# Patient Record
Sex: Female | Born: 1995 | Race: Black or African American | Hispanic: No | Marital: Single | Smoking: Former smoker
Health system: Southern US, Community
[De-identification: ages and names within clinical notes are randomized; demographics above are authoritative.]

## PROBLEM LIST (undated history)

## (undated) ENCOUNTER — Inpatient Hospital Stay: Payer: Self-pay

## (undated) DIAGNOSIS — D582 Other hemoglobinopathies: Secondary | ICD-10-CM

## (undated) DIAGNOSIS — F32A Depression, unspecified: Secondary | ICD-10-CM

## (undated) DIAGNOSIS — O99019 Anemia complicating pregnancy, unspecified trimester: Secondary | ICD-10-CM

## (undated) DIAGNOSIS — Z8619 Personal history of other infectious and parasitic diseases: Secondary | ICD-10-CM

## (undated) DIAGNOSIS — F329 Major depressive disorder, single episode, unspecified: Secondary | ICD-10-CM

## (undated) HISTORY — PX: NO PAST SURGERIES: SHX2092

## (undated) HISTORY — DX: Personal history of other infectious and parasitic diseases: Z86.19

---
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
## 2014-02-02 ENCOUNTER — Emergency Department: Payer: Self-pay | Admitting: Internal Medicine

## 2014-02-02 LAB — URINALYSIS, COMPLETE
BACTERIA: NONE SEEN
Bilirubin,UR: NEGATIVE
Blood: NEGATIVE
GLUCOSE, UR: NEGATIVE mg/dL (ref 0–75)
KETONE: NEGATIVE
Nitrite: NEGATIVE
PH: 5 (ref 4.5–8.0)
Protein: NEGATIVE
RBC,UR: 2 /HPF (ref 0–5)
Specific Gravity: 1.015 (ref 1.003–1.030)

## 2014-09-02 ENCOUNTER — Emergency Department
Admission: EM | Admit: 2014-09-02 | Discharge: 2014-09-02 | Disposition: A | Discharge status: Home / Self Care | Payer: 59 | Attending: Emergency Medicine | Admitting: Emergency Medicine

## 2014-09-02 ENCOUNTER — Encounter: Payer: Self-pay | Admitting: Emergency Medicine

## 2014-09-02 DIAGNOSIS — J988 Other specified respiratory disorders: Secondary | ICD-10-CM

## 2014-09-02 DIAGNOSIS — B9789 Other viral agents as the cause of diseases classified elsewhere: Secondary | ICD-10-CM

## 2014-09-02 DIAGNOSIS — Z720 Tobacco use: Secondary | ICD-10-CM | POA: Diagnosis not present

## 2014-09-02 DIAGNOSIS — J069 Acute upper respiratory infection, unspecified: Secondary | ICD-10-CM | POA: Insufficient documentation

## 2014-09-02 DIAGNOSIS — J029 Acute pharyngitis, unspecified: Secondary | ICD-10-CM | POA: Diagnosis present

## 2014-09-02 LAB — POCT RAPID STREP A: STREPTOCOCCUS, GROUP A SCREEN (DIRECT): NEGATIVE

## 2014-09-02 MED ORDER — PSEUDOEPH-BROMPHEN-DM 30-2-10 MG/5ML PO SYRP: 5.0000 mL | ORAL_SOLUTION | Freq: Four times a day (QID) | ORAL | Status: DC | PRN

## 2014-09-02 MED ORDER — IBUPROFEN 800 MG PO TABS: 800.0000 mg | ORAL_TABLET | Freq: Three times a day (TID) | ORAL | Status: DC | PRN

## 2014-09-02 NOTE — ED Notes (Signed)
Reports sore throat, coughing up green mucus, chills.  No resp distress

## 2014-09-02 NOTE — ED Provider Notes (Signed)
[facility] Emergency Department Provider Note  ____________________________________________  Time seen: Approximately 10:01 AM  I have reviewed the triage vital signs and the nursing notes.   HISTORY  Chief Complaint Sore Throat and Cough    HPI Carrie Frazier is a 19 y.o. female complaining of 3 days of sore throat and productive greenish cough and chills. Patient stated there is nausea but no vomiting. Patient denies diarrhea. No palliative measures taken for this complaint. Patient is rating her pain discomfort as a 10 over 10. Patient describes pain as dull.   History reviewed. No pertinent past medical history.  There are no active problems to display for this patient.   History reviewed. No pertinent past surgical history.  No current outpatient prescriptions on file.  Allergies Review of patient's allergies indicates no known allergies.  History reviewed. No pertinent family history.  Social History Social History  Substance Use Topics  . Smoking status: Current Some Day Smoker  . Smokeless tobacco: None  . Alcohol Use: Yes    Review of Systems Constitutional: Chills Eyes: No visual changes. ENT: Sore throat Cardiovascular: Denies chest pain. Respiratory: Denies shortness of breath. Gastrointestinal: No abdominal pain.  nausea, no vomiting.  No diarrhea.  No constipation. Genitourinary: Negative for dysuria. Musculoskeletal: Negative for back pain. Skin: Negative for rash. Neurological: Negative for headaches, focal weakness or numbness. 10-point ROS otherwise negative.  ____________________________________________   PHYSICAL EXAM:  VITAL SIGNS: ED Triage Vitals  Enc Vitals Group     BP 09/02/14 0947 119/70 mmHg     Pulse Rate 09/02/14 0947 82     Resp 09/02/14 0947 20     Temp 09/02/14 0947 98.7 F (37.1 C)     Temp Source 09/02/14 0947 Oral     SpO2 09/02/14 0947 100 %     Weight 09/02/14 0947 130 lb (58.968  kg)     Height 09/02/14 0947  (1.6 m)     Head Cir --      Peak Flow --      Pain Score 09/02/14 0948 10     Pain Loc --      Pain Edu? --      Excl. in GC? --     Constitutional: Alert and oriented. Well appearing and in no acute distress. Eyes: Conjunctivae are normal. PERRL. EOMI. Head: Atraumatic. Nose: Edematous nasal turbinates with clear rhinorrhea. Mouth/Throat: Mucous membranes are moist.  Oropharynx erythematous. Postnasal discharge Neck: No stridor.  No cervical spine tenderness to palpation. Hematological/Lymphatic/Immunilogical: No cervical lymphadenopathy. Cardiovascular: Normal rate, regular rhythm. Grossly normal heart sounds.  Good peripheral circulation. Respiratory: Normal respiratory effort.  No retractions. Lungs CTAB. Gastrointestinal: Soft and nontender. No distention. No abdominal bruits. No CVA tenderness. Musculoskeletal: No lower extremity tenderness nor edema.  No joint effusions. Neurologic:  Normal speech and language. No gross focal neurologic deficits are appreciated. No gait instability. Skin:  Skin is warm, dry and intact. No rash noted. Psychiatric: Mood and affect are normal. Speech and behavior are normal.  ____________________________________________   LABS (all labs ordered are listed, but only abnormal results are displayed)  Labs Reviewed  CULTURE, GROUP A STREP ([facility] ONLY)  POCT RAPID STREP A   ____________________________________________  EKG   ____________________________________________  RADIOLOGY   ____________________________________________   PROCEDURES  Procedure(s) performed: None  Critical Care performed: No  ____________________________________________   INITIAL IMPRESSION / ASSESSMENT AND PLAN / ED COURSE  Pertinent labs & imaging results that were available during my  care of the patient were reviewed by me and considered in my medical decision making (see chart for details).  Viral respiratory  infection. Discussed use none antibodies. Patient given a prescription for Brown fell DM and ibuprofen. [facility]. Patient given a work excuse. ____________________________________________   FINAL CLINICAL IMPRESSION(S) / ED DIAGNOSES  Final diagnoses:  Viral respiratory illness      Joni Reining, PA-C 09/02/14 1027  Minna Antis, MD 09/02/14 [phone]

## 2014-09-04 LAB — CULTURE, GROUP A STREP (THRC)

## 2014-09-05 NOTE — Progress Notes (Signed)
Dr. Gladstone Pih authorized amoxicillin 500 mg bid for 10 days for group a strep pharyngitis on 09/05/14. Attempted to call patient to find a suitable pharmacy, however phone number in computer is a wrong number.

## 2014-12-16 ENCOUNTER — Encounter: Payer: Self-pay | Admitting: *Deleted

## 2014-12-16 ENCOUNTER — Emergency Department
Admission: EM | Admit: 2014-12-16 | Discharge: 2014-12-16 | Discharge status: Against Medical Advice | Payer: 59 | Attending: Emergency Medicine | Admitting: Emergency Medicine

## 2014-12-16 DIAGNOSIS — R197 Diarrhea, unspecified: Secondary | ICD-10-CM | POA: Insufficient documentation

## 2014-12-16 DIAGNOSIS — R1110 Vomiting, unspecified: Secondary | ICD-10-CM | POA: Insufficient documentation

## 2014-12-16 DIAGNOSIS — F17200 Nicotine dependence, unspecified, uncomplicated: Secondary | ICD-10-CM | POA: Insufficient documentation

## 2014-12-16 DIAGNOSIS — R109 Unspecified abdominal pain: Secondary | ICD-10-CM | POA: Diagnosis present

## 2014-12-16 DIAGNOSIS — J029 Acute pharyngitis, unspecified: Secondary | ICD-10-CM | POA: Insufficient documentation

## 2014-12-16 MED ORDER — ONDANSETRON HCL 4 MG/2ML IJ SOLN: 4.0000 mg | Freq: Once | INTRAMUSCULAR | Status: DC | PRN

## 2014-12-16 NOTE — ED Notes (Signed)
Pt c/o abdominal pain starting early Friday morning. Pt reports repeated episodes of vomiting and diarrhea all day on Friday. Pt c/o sore throat starting in the past few hours along w/ chills.

## 2014-12-19 ENCOUNTER — Telehealth: Payer: Self-pay | Admitting: Emergency Medicine

## 2014-12-19 NOTE — ED Notes (Signed)
Called patient due to lwot to inquire about condition and follow up plans. Left message with my number. 

## 2015-03-28 ENCOUNTER — Encounter: Payer: Self-pay | Admitting: Obstetrics and Gynecology

## 2015-04-13 ENCOUNTER — Emergency Department
Admission: EM | Admit: 2015-04-13 | Discharge: 2015-04-13 | Disposition: A | Discharge status: Home / Self Care | Payer: 59 | Attending: Emergency Medicine | Admitting: Emergency Medicine

## 2015-04-13 ENCOUNTER — Encounter: Payer: Self-pay | Admitting: Emergency Medicine

## 2015-04-13 DIAGNOSIS — Z87891 Personal history of nicotine dependence: Secondary | ICD-10-CM | POA: Diagnosis not present

## 2015-04-13 DIAGNOSIS — F1290 Cannabis use, unspecified, uncomplicated: Secondary | ICD-10-CM | POA: Insufficient documentation

## 2015-04-13 DIAGNOSIS — S058X2A Other injuries of left eye and orbit, initial encounter: Secondary | ICD-10-CM

## 2015-04-13 DIAGNOSIS — Y999 Unspecified external cause status: Secondary | ICD-10-CM | POA: Insufficient documentation

## 2015-04-13 DIAGNOSIS — H109 Unspecified conjunctivitis: Secondary | ICD-10-CM | POA: Diagnosis present

## 2015-04-13 DIAGNOSIS — S0502XA Injury of conjunctiva and corneal abrasion without foreign body, left eye, initial encounter: Secondary | ICD-10-CM | POA: Insufficient documentation

## 2015-04-13 DIAGNOSIS — Y929 Unspecified place or not applicable: Secondary | ICD-10-CM | POA: Insufficient documentation

## 2015-04-13 DIAGNOSIS — Y9389 Activity, other specified: Secondary | ICD-10-CM | POA: Diagnosis not present

## 2015-04-13 MED ORDER — FLUORESCEIN SODIUM 1 MG OP STRP
1.0000 | ORAL_STRIP | Freq: Once | OPHTHALMIC | Status: DC
  Filled 2015-04-13: qty 1

## 2015-04-13 MED ORDER — EYE WASH OPHTH SOLN
2.0000 [drp] | OPHTHALMIC | Status: DC | PRN
  Filled 2015-04-13 (×2): qty 118

## 2015-04-13 MED ORDER — TETRACAINE HCL 0.5 % OP SOLN
2.0000 [drp] | Freq: Once | OPHTHALMIC | Status: DC
  Filled 2015-04-13: qty 2

## 2015-04-13 MED ORDER — ERYTHROMYCIN 5 MG/GM OP OINT: 1.0000 "application " | TOPICAL_OINTMENT | Freq: Four times a day (QID) | OPHTHALMIC | Status: DC

## 2015-04-13 MED ORDER — ERYTHROMYCIN 5 MG/GM OP OINT
TOPICAL_OINTMENT | Freq: Once | OPHTHALMIC | Status: DC
  Filled 2015-04-13: qty 1

## 2015-04-13 NOTE — ED Notes (Signed)
Pt states that she was wrestling with her nephew yesterday and he stuck his finger in her left eye, pt has blood noted to the inner portion of the sclera with swelling to her upper eyelid, pt states it's painful to move her eye. Pt also states that she has a headache

## 2015-04-13 NOTE — Discharge Instructions (Signed)

## 2015-04-13 NOTE — ED Notes (Signed)
Left eye redness, "goopy" upon waking up this am, and itching.

## 2015-04-13 NOTE — ED Provider Notes (Signed)
[facility] Emergency Department Provider Note  ____________________________________________  Time seen: Approximately 8:07 AM  I have reviewed the triage vital signs and the nursing notes.   HISTORY  Chief Complaint Conjunctivitis    HPI Carrie Frazier is a 20 y.o. female, NAD, presents to the emergency department with one-day history of left eye redness, pain, oozing and itching. States her nephew stuck his finger in her left eye yesterday and has had increasing blood in that area over the last 24 hours. Woke this morning with swelling about the eyelids and yellow matted discharge. Has had pain and itching about the eye today. Swelling has gone down since waking this morning. Denies any loss of vision but has felt her vision is blurry due to the discharge.    History reviewed. No pertinent past medical history.  There are no active problems to display for this patient.   History reviewed. No pertinent past surgical history.  Current Outpatient Rx  Name  Route  Sig  Dispense  Refill  . brompheniramine-pseudoephedrine-DM 30-2-10 MG/5ML syrup   Oral   Take 5 mLs by mouth 4 (four) times daily as needed.   120 mL   0   . erythromycin ophthalmic ointment   Left Eye   Place 1 application into the left eye 4 (four) times daily. Apply 1cm ribbon to lower eyelid 4 times daily.   3.5 g   0   . ibuprofen (ADVIL,MOTRIN) 800 MG tablet   Oral   Take 1 tablet (800 mg total) by mouth every 8 (eight) hours as needed for moderate pain.   15 tablet   0     Allergies Review of patient's allergies indicates no known allergies.  No family history on file.  Social History Social History  Substance Use Topics  . Smoking status: Former Games developermoker  . Smokeless tobacco: None  . Alcohol Use: Yes     Review of Systems  Constitutional: No fever/chills Eyes: Positive blurry vision. Positive yellow discharge, eye redness, eye pain about the left eye.  Positive left eyelid swelling. No loss of vision. ENT: Positive nasal congestion, runny nose. No sore throat, sinus pressure. Cardiovascular: No chest pain. Respiratory: No cough.   Skin: Negative for rash, skin sores. Neurological: Negative for headaches, focal weakness or numbness. 10-point ROS otherwise negative.  ____________________________________________   PHYSICAL EXAM:  VITAL SIGNS: ED Triage Vitals  Enc Vitals Group     BP 04/13/15 0736 91/65 mmHg     Pulse Rate 04/13/15 0736 80     Resp 04/13/15 0736 18     Temp 04/13/15 0736 98.2 F (36.8 C)     Temp Source 04/13/15 0736 Oral     SpO2 04/13/15 0736 100 %     Weight 04/13/15 0733 120 lb (54.432 kg)     Height 04/13/15 0733 5\' 3"  (1.6 m)     Head Cir --      Peak Flow --      Pain Score 04/13/15 0734 10     Pain Loc --      Pain Edu? --      Excl. in GC? --     Constitutional: Alert and oriented. Well appearing and in no acute distress. Eyes: Left conjunctiva with erythema and hemorrhage in the medial upper portion. Fluorescein uptake was visualized under black light in the medial upper portion of the left eye in a linear fashion approximately 1 cm in length and 2 mm in width. No waterfall  sign was noted. PERRL. EOMI without pain.  Head: Atraumatic. ENT:      Nose: No congestion but trace rhinnorhea.      Mouth/Throat: Mucous membranes are moist.  Neck: Supple with full range of motion Hematological/Lymphatic/Immunilogical: No cervical lymphadenopathy. Respiratory: Normal respiratory effort without tachypnea or retractions.  Neurologic:  Normal speech and language. No gross focal neurologic deficits are appreciated.  Skin:  Skin is warm, dry and intact. No rash noted. Psychiatric: Mood and affect are normal. Speech and behavior are normal. Patient exhibits appropriate insight and judgement.   ____________________________________________    LABS  None  ____________________________________________  EKG  None ____________________________________________  RADIOLOGY  None ____________________________________________    PROCEDURES  Procedure(s) performed: None    Medications  tetracaine (PONTOCAINE) 0.5 % ophthalmic solution 2 drop (not administered)  fluorescein ophthalmic strip 1 strip (not administered)  eye wash ((SODIUM/POTASSIUM/SOD CHLORIDE)) ophthalmic solution 2 drop (not administered)  erythromycin ophthalmic ointment (not administered)     ____________________________________________   INITIAL IMPRESSION / ASSESSMENT AND PLAN / ED COURSE  Patient's diagnosis is consistent with abrasion of left eye. Application of erythromycin ophthalmic ointment was completed about the left lower eyelid prior to the patient being discharged. She'll also be discharged home with prescription for erythromycin ophthalmic ointment and counseled on use of such. She is to follow-up with [facility] today or tomorrow. Patient was given address and phone number to call them this morning to go ahead and schedule an appointment for follow-up.  Patient is given ED precautions to return to the ED for any worsening or new symptoms.    ____________________________________________  FINAL CLINICAL IMPRESSION(S) / ED DIAGNOSES  Final diagnoses:  Abrasion of left eye, initial encounter      NEW MEDICATIONS STARTED DURING THIS VISIT:  Discharge Medication List as of 04/13/2015  8:42 AM    START taking these medications   Details  erythromycin ophthalmic ointment Place 1 application into the left eye 4 (four) times daily. Apply 1cm ribbon to lower eyelid 4 times daily., Starting 04/13/2015, Until Discontinued, Print             Hope Pigeon, PA-C 04/13/15 9147  Jennye Moccasin, MD 04/13/15 [phone]

## 2015-06-28 DIAGNOSIS — F40231 Fear of injections and transfusions: Secondary | ICD-10-CM | POA: Insufficient documentation

## 2015-06-28 LAB — OB RESULTS CONSOLE VARICELLA ZOSTER ANTIBODY, IGG: VARICELLA IGG: IMMUNE

## 2015-06-28 LAB — OB RESULTS CONSOLE HEPATITIS B SURFACE ANTIGEN: Hepatitis B Surface Ag: NEGATIVE

## 2015-06-28 LAB — HM HIV SCREENING LAB: HM HIV Screening: NEGATIVE

## 2015-06-28 LAB — OB RESULTS CONSOLE RUBELLA ANTIBODY, IGM: RUBELLA: IMMUNE

## 2015-09-14 ENCOUNTER — Other Ambulatory Visit: Payer: Self-pay | Admitting: Obstetrics & Gynecology

## 2015-09-14 DIAGNOSIS — Z3689 Encounter for other specified antenatal screening: Secondary | ICD-10-CM

## 2015-09-20 ENCOUNTER — Ambulatory Visit
Admission: RE | Admit: 2015-09-20 | Discharge: 2015-09-20 | Disposition: A | Discharge status: Home / Self Care | Payer: 59 | Source: Ambulatory Visit | Attending: Obstetrics & Gynecology | Admitting: Obstetrics & Gynecology

## 2015-09-20 ENCOUNTER — Encounter: Payer: Self-pay | Admitting: Radiology

## 2015-09-20 DIAGNOSIS — Z36 Encounter for antenatal screening of mother: Secondary | ICD-10-CM | POA: Diagnosis present

## 2015-09-20 DIAGNOSIS — Z3A21 21 weeks gestation of pregnancy: Secondary | ICD-10-CM | POA: Diagnosis not present

## 2015-09-20 DIAGNOSIS — Z3689 Encounter for other specified antenatal screening: Secondary | ICD-10-CM

## 2015-11-06 LAB — OB RESULTS CONSOLE HIV ANTIBODY (ROUTINE TESTING): HIV: NONREACTIVE

## 2015-11-06 LAB — OB RESULTS CONSOLE RPR: RPR: NONREACTIVE

## 2015-11-26 ENCOUNTER — Inpatient Hospital Stay
Admission: EM | Admit: 2015-11-26 | Discharge: 2015-11-26 | Disposition: A | Discharge status: Home / Self Care | Payer: 59 | Attending: Certified Nurse Midwife | Admitting: Certified Nurse Midwife

## 2015-11-26 DIAGNOSIS — W07XXXA Fall from chair, initial encounter: Secondary | ICD-10-CM | POA: Diagnosis not present

## 2015-11-26 DIAGNOSIS — S30811A Abrasion of abdominal wall, initial encounter: Secondary | ICD-10-CM | POA: Insufficient documentation

## 2015-11-26 DIAGNOSIS — Y92000 Kitchen of unspecified non-institutional (private) residence as  the place of occurrence of the external cause: Secondary | ICD-10-CM | POA: Insufficient documentation

## 2015-11-26 DIAGNOSIS — Z8249 Family history of ischemic heart disease and other diseases of the circulatory system: Secondary | ICD-10-CM | POA: Insufficient documentation

## 2015-11-26 DIAGNOSIS — Z87891 Personal history of nicotine dependence: Secondary | ICD-10-CM | POA: Insufficient documentation

## 2015-11-26 DIAGNOSIS — S80811A Abrasion, right lower leg, initial encounter: Secondary | ICD-10-CM | POA: Diagnosis not present

## 2015-11-26 DIAGNOSIS — Z3A31 31 weeks gestation of pregnancy: Secondary | ICD-10-CM | POA: Insufficient documentation

## 2015-11-26 DIAGNOSIS — Z8619 Personal history of other infectious and parasitic diseases: Secondary | ICD-10-CM | POA: Diagnosis not present

## 2015-11-26 NOTE — Discharge Instructions (Signed)
Drink plenty of fluid and get plenty of rest. Call your provider for any other concerns °

## 2015-11-26 NOTE — OB Triage Note (Signed)
Carrie Frazier here post fall, describes event as not having a key to her home, climbed on a chair outside window, attempted to get in through open window, lost balance, went backward, chair tipped backward and she then fell through chair arm holes, landed on her abdomen and scraped her right leg, 2 small red marks on abdomen, one small abrasion to right leg.  This is her usual method of entry if no one is home.

## 2015-11-26 NOTE — Final Progress Note (Signed)
Physician Final Progress Note  Patient ID: Carrie Frazier MRN: [id number] DOB/AGE: August 18, 1995 19 y.o.  Admit date: 11/26/2015 Admitting provider: Vena AustriaAndreas Staebler, MD Gasper Lloydolleen L. Sharen HonesGutierrez, CNM Discharge date: 11/26/2015   Admission Diagnoses: trauma in pregnancy IUP at 31.[redacted] wks gestation  Discharge Diagnoses:  Same as above No evidence of fetal compromise Consults: none  Significant Findings/ Diagnostic Studies: 20 year old G1 P0 with EDC=01/27/2016 by a LMP=04/22/2015 presented at 31 wk 1 day for monitoring after she tried to climb through her kitchen window. She did not fit and slid back, she was hanging onto the window, trying to put her leg on the chair she stood on to get into the window. The chair had fallen, and she scraped her right lower leg on it. She then dropped down scraping her upper abdomen. Denies vaginal bleeding, or contractions. Complains of pain in her lower right leg and upper abdominal area from the abrasions.  [facility] remarkable for a history of depression, equivocal Chlamydia PCRs x 2 (unsure if treated), anemia, and concerns for fetal arrhythmia Current medications: prenatal vitamins Surgical Hx: no prior surgery Social Hx: former smoker Family Hx: positive for HTN (MGM) Exam: 129/76 Pulse 100 General: in NAD, alert, asking appropriate questions Abdomen: two red raised papular lesions on left upper abdomen, mild tenderness in upper abdomen with doing ultrasound Ultrasound: cephalic presentation, right fundal posterior placenta, baby moving well on ultrasound Extremity: approximately 4 cm long abrasion on her right shin FHR: 145 with accelerations to 160s to 170, moderate variability Toco: no contractions seen  A: IUP at 31.1 weeks with minor abrasions from trying to crawl through a window [id number] No evidence of fetal compromise  P: DC home with abruption precautions    Procedures: none   Discharge  Condition: stable  Disposition: 01-Home or Self Care  Diet: Regular diet  Discharge Activity: Activity as tolerated Cautioned against trying to crawl through a window again     Medication List    TAKE these medications   multivitamin-prenatal 27-0.8 MG Tabs tablet Take 1 tablet by mouth daily at 12 noon.      Follow-up Information    [facility] Follow up on 12/10/2015.   Contact information: [address] [place] [address] [phone]           Total time spent taking care of this patient: 20 minutes  Signed: Farrel ConnersGUTIERREZ, Tarica Harl 11/26/2015, 4:41 PM

## 2016-01-03 LAB — OB RESULTS CONSOLE GBS: STREP GROUP B AG: POSITIVE

## 2016-01-07 NOTE — L&D Delivery Note (Signed)
Delivery Note Primary OB: [facility] Delivery Physician: Carrie MajorPaul Jahrel Borthwick, MD Gestational Age: Full term Antepartum complications: infection and (this was chlamydia, treated) Intrapartum complications: None  A viable Female was delivered via vertex perentation.  Apgars:8 ,9  Weight:  6 lb 4 oz .   Placenta status: manual removal and Intact.  Cord: 3+ vessels;  with the following complications: nuchal.  Anesthesia:  epidural Episiotomy:  none Lacerations:  periuretheral Suture Repair: 2.0 vicryl Est. Blood Loss (mL):  200 mL  Mom to postpartum.  Baby to Couplet care / Skin to Skin.  Carrie MajorPaul Carrie Heslin, MD Dept of OB/GYN [phone]

## 2016-01-29 ENCOUNTER — Inpatient Hospital Stay
Admission: EM | Admit: 2016-01-29 | Discharge: 2016-02-03 | DRG: 775 | Disposition: A | Discharge status: Home / Self Care | Payer: 59 | Attending: Obstetrics & Gynecology | Admitting: Obstetrics & Gynecology

## 2016-01-29 ENCOUNTER — Observation Stay
Admission: EM | Admit: 2016-01-29 | Discharge: 2016-01-29 | Disposition: A | Discharge status: Home / Self Care | Payer: 59 | Source: Home / Self Care | Admitting: Obstetrics & Gynecology

## 2016-01-29 DIAGNOSIS — O99344 Other mental disorders complicating childbirth: Secondary | ICD-10-CM | POA: Diagnosis present

## 2016-01-29 DIAGNOSIS — Z3A40 40 weeks gestation of pregnancy: Secondary | ICD-10-CM

## 2016-01-29 DIAGNOSIS — F329 Major depressive disorder, single episode, unspecified: Secondary | ICD-10-CM | POA: Diagnosis present

## 2016-01-29 DIAGNOSIS — Z87891 Personal history of nicotine dependence: Secondary | ICD-10-CM

## 2016-01-29 DIAGNOSIS — D582 Other hemoglobinopathies: Secondary | ICD-10-CM | POA: Diagnosis present

## 2016-01-29 DIAGNOSIS — O4103X0 Oligohydramnios, third trimester, not applicable or unspecified: Principal | ICD-10-CM | POA: Diagnosis present

## 2016-01-29 DIAGNOSIS — O9902 Anemia complicating childbirth: Secondary | ICD-10-CM | POA: Diagnosis present

## 2016-01-29 DIAGNOSIS — O471 False labor at or after 37 completed weeks of gestation: Secondary | ICD-10-CM

## 2016-01-29 DIAGNOSIS — D649 Anemia, unspecified: Secondary | ICD-10-CM | POA: Diagnosis present

## 2016-01-29 DIAGNOSIS — O99824 Streptococcus B carrier state complicating childbirth: Secondary | ICD-10-CM | POA: Diagnosis present

## 2016-01-29 DIAGNOSIS — Z8249 Family history of ischemic heart disease and other diseases of the circulatory system: Secondary | ICD-10-CM

## 2016-01-29 DIAGNOSIS — O4100X0 Oligohydramnios, unspecified trimester, not applicable or unspecified: Secondary | ICD-10-CM | POA: Diagnosis present

## 2016-01-29 HISTORY — DX: Major depressive disorder, single episode, unspecified: F32.9

## 2016-01-29 HISTORY — DX: Anemia complicating pregnancy, unspecified trimester: O99.019

## 2016-01-29 HISTORY — DX: Depression, unspecified: F32.A

## 2016-01-29 HISTORY — DX: Other hemoglobinopathies: D58.2

## 2016-01-29 LAB — TYPE AND SCREEN
ABO/RH(D): B POS
Antibody Screen: NEGATIVE

## 2016-01-29 LAB — CBC
HCT: 29.9 % — ABNORMAL LOW (ref 35.0–47.0)
Hemoglobin: 10.4 g/dL — ABNORMAL LOW (ref 12.0–16.0)
MCH: 25.2 pg — ABNORMAL LOW (ref 26.0–34.0)
MCHC: 34.8 g/dL (ref 32.0–36.0)
MCV: 72.5 fL — AB (ref 80.0–100.0)
PLATELETS: 185 10*3/uL (ref 150–440)
RBC: 4.13 MIL/uL (ref 3.80–5.20)
RDW: 17.3 % — AB (ref 11.5–14.5)
WBC: 10.7 10*3/uL (ref 3.6–11.0)

## 2016-01-29 LAB — CHLAMYDIA/NGC RT PCR (ARMC ONLY)
Chlamydia Tr: NOT DETECTED
N gonorrhoeae: NOT DETECTED

## 2016-01-29 MED ORDER — LACTATED RINGERS IV SOLN
500.0000 mL | INTRAVENOUS | Status: DC | PRN
  Administered 2016-01-31: 250 mL via INTRAVENOUS
  Administered 2016-01-31: 500 mL via INTRAVENOUS
  Administered 2016-02-01 (×3): 250 mL via INTRAVENOUS

## 2016-01-29 MED ORDER — DINOPROSTONE 10 MG VA INST
10.0000 mg | VAGINAL_INSERT | Freq: Once | VAGINAL | Status: AC
  Administered 2016-01-29: 10 mg via VAGINAL
  Filled 2016-01-29: qty 1

## 2016-01-29 MED ORDER — LACTATED RINGERS IV SOLN
INTRAVENOUS | Status: DC
  Administered 2016-01-29 – 2016-01-31 (×2): via INTRAVENOUS
  Administered 2016-01-31: 125 mL/h via INTRAVENOUS
  Administered 2016-02-01 (×2): via INTRAVENOUS

## 2016-01-29 MED ORDER — MISOPROSTOL 200 MCG PO TABS: 800.0000 ug | ORAL_TABLET | Freq: Once | ORAL | Status: DC | PRN

## 2016-01-29 MED ORDER — AMPICILLIN SODIUM 1 G IJ SOLR
1.0000 g | INTRAMUSCULAR | Status: AC
  Administered 2016-01-30 – 2016-01-31 (×12): 1 g via INTRAVENOUS
  Filled 2016-01-29 (×12): qty 1000

## 2016-01-29 MED ORDER — OXYTOCIN BOLUS FROM INFUSION
500.0000 mL | Freq: Once | INTRAVENOUS | Status: AC
  Administered 2016-02-01: 500 mL via INTRAVENOUS

## 2016-01-29 MED ORDER — AMMONIA AROMATIC IN INHA: 0.3000 mL | Freq: Once | RESPIRATORY_TRACT | Status: DC | PRN

## 2016-01-29 MED ORDER — ONDANSETRON HCL 4 MG/2ML IJ SOLN
4.0000 mg | Freq: Four times a day (QID) | INTRAMUSCULAR | Status: DC | PRN
  Administered 2016-01-31: 4 mg via INTRAVENOUS
  Filled 2016-01-29: qty 2

## 2016-01-29 MED ORDER — LIDOCAINE HCL (PF) 1 % IJ SOLN: 30.0000 mL | INTRAMUSCULAR | Status: DC | PRN

## 2016-01-29 MED ORDER — OXYTOCIN 40 UNITS IN LACTATED RINGERS INFUSION - SIMPLE MED: 2.5000 [IU]/h | INTRAVENOUS | Status: DC

## 2016-01-29 MED ORDER — SODIUM CHLORIDE 0.9 % IV SOLN
2.0000 g | Freq: Once | INTRAVENOUS | Status: AC
  Administered 2016-01-29: 2 g via INTRAVENOUS
  Filled 2016-01-29: qty 2000

## 2016-01-29 NOTE — H&P (Signed)
OB History & Physical   History of Present Illness:  Chief Complaint:  Presents for an IOL HPI:  Carrie Frazier is a 21 y.o. G1P0 female with EDC=01/27/2016 at 18w2ddated by LMP.  Her pregnancy has been complicated by equivocal Chlamydia positive results (never took Azithromycin prescribed) with a negative PCR 01/01/2017, concerns for a possible VSD and a fetal arrhythmia ( no VSD on repeat scan and arrhythmia resolved), and depression. . She has hemoglobin C trait and also had anemia with pregnancy. Was not compliant with taking vitamins and iron.She presents to L&D for induction of labor for oligo hydraminos. She was seen earlier today for contractions and when an ultrasound was done to confirm presentation, she was noted to have an AFI=2.6cm. Baby has been active. She denies leakage of fluid or vaginal bleeding.   [facility] Breast [place]. Birth control: undecided. Did not receive TDAP during pregnancy or flu vaccine. Has needle phobia      Maternal Medical History:   Past Medical History:  Diagnosis Date  . Anemia affecting pregnancy   . Depression   . Hemoglobin C trait ([facility]     Past Surgical History:  Procedure Laterality Date  . NO PAST SURGERIES      No Known Allergies  Prior to Admission medications   Medication Sig Start Date End Date Taking? Authorizing Provider  Prenatal Vit-Fe Fumarate-FA (MULTIVITAMIN-PRENATAL) 27-0.8 MG TABS tablet Take 1 tablet by mouth daily at 12 noon.    Historical Provider, MD          Social History: She  reports that she has quit smoking. She has never used smokeless tobacco. She reports that she drinks alcohol. She reports that she uses drugs, including Marijuana.  Family History: positive for multiple birth (MGM is a twin, pat aunt had twins) and cancer( mat great uncle deceased with prostate) and hypertension (MGM)   Review of Systems:  Negative x 10 systems reviewed except as noted in the HPI.      Physical Exam:  Vital Signs: BP 117/70   Pulse 87   Temp 98.5 F (36.9 C) (Oral)   Resp 18   Ht '5\' 3"'  (1.6 m)   Wt 84.4 kg (186 lb)   LMP 04/22/2015 (LMP Unknown)   BMI 32.95 kg/m  General: no acute distress.  HEENT: normocephalic, atraumatic Heart: regular rate & rhythm.  No murmurs/rubs/gallops Lungs: clear to auscultation bilaterally Abdomen: soft, gravid, non-tender;  EFW:6 Pelvic:   External: Normal external female genitalia  Cervix: closed/50%/-1 to 0  Extremities: non-tender, symmetric, trace edema bilaterally.  DTRs: +2 Neurologic: Alert & oriented x 3.    Pertinent Results:  Prenatal Labs: Blood type/Rh B positive  Antibody screen negative  Rubella Varicella MMR x2 Nonimmune  RPR negative  HBsAg negative  HIV negative  GC negative  Chlamydia negative  Genetic screening declined  1 hour GTT 74  3 hour GTT NA  GBS positive on 01/01/2106   Baseline FHR: 145-150 with prolonged accelerations, moderate variability with one deceleration to 60s x 90 sec while supine which resolved with position change only. Bedside Ultrasound:  Number of Fetus: singleton  Presentation: cephalic  Fluid: 22.9BM   Assessment:  Carrie NTaquana Bartleyis a 21y.o. G1P0 female at 463w2dith oligohydraminos admitted for IOL Reactive NST earlier today-had a 1-2 min deceleration to 60s shortly after admission with return to baseline. Baby very active with prolonged  accelerations also.  Plan:  1. Admit to Labor & Delivery   2. CBC, T&S, NPO with sips and chips for now, IVF 3. GBS positive-begin Ampicillin.   4. Consents obtained. 5. Monitor continuously. Will watch FHR closely over the next hour and if no further decelerations, will insert Cervidil.  Carrie Frazier  01/30/2016 9:06 AM

## 2016-01-29 NOTE — OB Triage Note (Signed)
Pt presents c/o ctx 10 mins apart that started in the middle of the night. Denies any bleeding, LOF. Pt reports throwing up yesterday and not feeling well. Pt states she has not felt baby move today.

## 2016-01-29 NOTE — Discharge Instructions (Signed)
Come in through the ER at 8:00 pm tonight 01-29-16. Tell them you are here for scheduled induction of labor and they will get you registered and bring you up to the floor. Please eat a good meal before you come in.

## 2016-01-29 NOTE — Progress Notes (Signed)
Reviewed discharge instructions with patient. Did Some patient education on IOL. Pt discharged home. Will be returning this evening for IOL for oligo.

## 2016-01-29 NOTE — Final Progress Note (Signed)
Physician Final Progress Note  Patient ID: Carrie Frazier MRN: [id number] DOB/AGE: [birth year] 20 y.o.  Admit date: 01/29/2016 Admitting provider: Nadara Mustardobert P Harris, MD/ Gasper Lloydolleen L. Sharen HonesGutierrez, CNM Discharge date: 01/29/2016   Admission Diagnoses: Contractions  Discharge Diagnoses:  Oligohydraminos False labor  Consults:none  Significant Findings/ Diagnostic Studies: 21 yo BF G1 P0 with EDC=01/27/2016 presented at 40wk2d with contractions every 10 minutes. Contractions started in the middle of the night...was not able to sleep. Had no vaginal bleeding or leakage of fluid. FHR tracing was reactive and there were  occasional contractions. Cervix was closed/50%/-1 to 0. Ultrasound was done at bedside for presentation and she had little fluid. AFI was 2.6cm. She was advised that oligo hydraminos at term is an indication for induction of labor. Will discharge patient now and have return at 2000 tonight for an induction with Cervidil due to unripe cervix. Discussed the probability of the induction taking 2-3 days due to unripe cervix.   Procedures: Bedside ultrasound done for presentation: cephalic AFI-one pocket measuring 2.6 cm Placenta posterior and multiple calcifications  Discharge Condition: stable  Disposition: 01-Home or Self Care  Diet: Regular diet  Discharge Activity: Activity as tolerated      Total time spent taking care of this patient: 20 minutes with greater than 50% of the time discussing oligo and plan for induction of labor  Signed: Farrel ConnersGUTIERREZ, Zeke Aker 01/29/2016, 6:04 PM

## 2016-01-30 ENCOUNTER — Encounter: Payer: Self-pay | Admitting: Certified Nurse Midwife

## 2016-01-30 LAB — URINE DRUG SCREEN, QUALITATIVE (ARMC ONLY)
AMPHETAMINES, UR SCREEN: NOT DETECTED
BARBITURATES, UR SCREEN: NOT DETECTED
BENZODIAZEPINE, UR SCRN: NOT DETECTED
Cannabinoid 50 Ng, Ur ~~LOC~~: NOT DETECTED
Cocaine Metabolite,Ur ~~LOC~~: NOT DETECTED
MDMA (Ecstasy)Ur Screen: NOT DETECTED
METHADONE SCREEN, URINE: NOT DETECTED
OPIATE, UR SCREEN: NOT DETECTED
Phencyclidine (PCP) Ur S: NOT DETECTED
TRICYCLIC, UR SCREEN: NOT DETECTED

## 2016-01-30 LAB — OB RESULTS CONSOLE GC/CHLAMYDIA
Chlamydia: NEGATIVE
Gonorrhea: NEGATIVE

## 2016-01-30 MED ORDER — AMMONIA AROMATIC IN INHA
RESPIRATORY_TRACT | Status: AC
  Filled 2016-01-30: qty 10

## 2016-01-30 MED ORDER — TERBUTALINE SULFATE 1 MG/ML IJ SOLN
0.2500 mg | Freq: Once | INTRAMUSCULAR | Status: DC | PRN
  Filled 2016-01-30: qty 1

## 2016-01-30 MED ORDER — OXYTOCIN 40 UNITS IN LACTATED RINGERS INFUSION - SIMPLE MED
1.0000 m[IU]/min | INTRAVENOUS | Status: DC
  Administered 2016-01-30: 2 m[IU]/min via INTRAVENOUS
  Administered 2016-01-31: 1 m[IU]/min via INTRAVENOUS
  Filled 2016-01-30 (×2): qty 1000

## 2016-01-30 MED ORDER — FAMOTIDINE 20 MG PO TABS
20.0000 mg | ORAL_TABLET | Freq: Two times a day (BID) | ORAL | Status: DC | PRN
  Administered 2016-01-30: 20 mg via ORAL
  Filled 2016-01-30: qty 1

## 2016-01-30 MED ORDER — SODIUM CHLORIDE 0.9 % IJ SOLN
INTRAMUSCULAR | Status: AC
  Filled 2016-01-30: qty 50

## 2016-01-30 MED ORDER — OXYTOCIN 10 UNIT/ML IJ SOLN
INTRAMUSCULAR | Status: DC
Start: 2016-01-30 — End: 2016-02-01
  Filled 2016-01-30: qty 2

## 2016-01-30 MED ORDER — MISOPROSTOL 200 MCG PO TABS
ORAL_TABLET | ORAL | Status: AC
  Filled 2016-01-30: qty 4

## 2016-01-30 MED ORDER — LIDOCAINE HCL (PF) 1 % IJ SOLN
INTRAMUSCULAR | Status: AC
  Filled 2016-01-30: qty 30

## 2016-01-30 NOTE — Progress Notes (Signed)
Subjective:  Doing well no concerns.  Feeling some light cramping but no real contractions  Objective:   Vitals: Blood pressure 117/70, pulse 87, temperature 98.5 F (36.9 C), temperature source Oral, resp. rate 18, height 5\' 3"  (1.6 m), weight 186 lb (84.4 kg), last menstrual period 04/22/2015. General:  NAD Abdomen: gravid, non-tender Cervical Exam:  deferred    FHT: 120, moderate, +accels, no decels Toco: irritability  Results for orders placed or performed during the hospital encounter of 01/29/16 (from the past 24 hour(s))  Type and screen [facility]     Status: None   Collection Time: 01/29/16  9:35 PM  Result Value Ref Range   ABO/RH(D) B POS    Antibody Screen NEG    Sample Expiration 02/01/2016   Chlamydia/NGC rt PCR ([facility] only)     Status: None   Collection Time: 01/29/16  9:35 PM  Result Value Ref Range   Specimen source GC/Chlam ENDOCERVICAL    Chlamydia Tr NOT DETECTED NOT DETECTED   N gonorrhoeae NOT DETECTED NOT DETECTED  Urine Drug Screen, Qualitative ([facility] only)     Status: None   Collection Time: 01/29/16  9:35 PM  Result Value Ref Range   Tricyclic, Ur Screen NONE DETECTED NONE DETECTED   Amphetamines, Ur Screen NONE DETECTED NONE DETECTED   MDMA (Ecstasy)Ur Screen NONE DETECTED NONE DETECTED   Cocaine Metabolite,Ur [place] NONE DETECTED NONE DETECTED   Opiate, Ur Screen NONE DETECTED NONE DETECTED   Phencyclidine (PCP) Ur S NONE DETECTED NONE DETECTED   Cannabinoid 50 Ng, Ur [place] NONE DETECTED NONE DETECTED   Barbiturates, Ur Screen NONE DETECTED NONE DETECTED   Benzodiazepine, Ur Scrn NONE DETECTED NONE DETECTED   Methadone Scn, Ur NONE DETECTED NONE DETECTED  CBC     Status: Abnormal   Collection Time: 01/29/16 10:13 PM  Result Value Ref Range   WBC 10.7 3.6 - 11.0 K/uL   RBC 4.13 3.80 - 5.20 MIL/uL   Hemoglobin 10.4 (L) 12.0 - 16.0 g/dL   HCT 16.129.9 (L) 09.635.0 - 04.547.0 %   MCV 72.5 (L) 80.0 - 100.0 fL   MCH 25.2 (L) 26.0 - 34.0 pg   MCHC 34.8 32.0 - 36.0 g/dL   RDW 40.917.3 (H) 81.111.5 - 91.414.5 %   Platelets 185 150 - 440 K/uL  OB RESULTS CONSOLE GC/Chlamydia     Status: None   Collection Time: 01/30/16 12:00 AM  Result Value Ref Range   Gonorrhea Negative    Chlamydia Negative     Assessment:   [age] y.o. G1P0 6269w3d IOL for oligohydramnios  Plan:   1) Labor - continue cervidil, remove at 10:30 will recheck at that time to determine how to continue induction  2) Fetus - category I tracing

## 2016-01-30 NOTE — Progress Notes (Signed)
Subjective:  Comfortable  Objective:   Vitals: Blood pressure 128/80, pulse 80, temperature 98.5 F (36.9 C), temperature source Oral, resp. rate 18, height 5\' 3"  (1.6 m), weight 186 lb (84.4 kg), last menstrual period 04/22/2015. General: NAD Abdomen:gravid, non-tender Cervical Exam: ftp    FHT: 140, moderate, +accels, no decels Toco: irregular  Results for orders placed or performed during the hospital encounter of 01/29/16 (from the past 24 hour(s))  Type and screen [facility]     Status: None   Collection Time: 01/29/16  9:35 PM  Result Value Ref Range   ABO/RH(D) B POS    Antibody Screen NEG    Sample Expiration 02/01/2016   Chlamydia/NGC rt PCR ([facility] only)     Status: None   Collection Time: 01/29/16  9:35 PM  Result Value Ref Range   Specimen source GC/Chlam ENDOCERVICAL    Chlamydia Tr NOT DETECTED NOT DETECTED   N gonorrhoeae NOT DETECTED NOT DETECTED  Urine Drug Screen, Qualitative ([facility] only)     Status: None   Collection Time: 01/29/16  9:35 PM  Result Value Ref Range   Tricyclic, Ur Screen NONE DETECTED NONE DETECTED   Amphetamines, Ur Screen NONE DETECTED NONE DETECTED   MDMA (Ecstasy)Ur Screen NONE DETECTED NONE DETECTED   Cocaine Metabolite,Ur [place] NONE DETECTED NONE DETECTED   Opiate, Ur Screen NONE DETECTED NONE DETECTED   Phencyclidine (PCP) Ur S NONE DETECTED NONE DETECTED   Cannabinoid 50 Ng, Ur [place] NONE DETECTED NONE DETECTED   Barbiturates, Ur Screen NONE DETECTED NONE DETECTED   Benzodiazepine, Ur Scrn NONE DETECTED NONE DETECTED   Methadone Scn, Ur NONE DETECTED NONE DETECTED  CBC     Status: Abnormal   Collection Time: 01/29/16 10:13 PM  Result Value Ref Range   WBC 10.7 3.6 - 11.0 K/uL   RBC 4.13 3.80 - 5.20 MIL/uL   Hemoglobin 10.4 (L) 12.0 - 16.0 g/dL   HCT 28.429.9 (L) 13.235.0 - 44.047.0 %   MCV 72.5 (L) 80.0 - 100.0 fL   MCH 25.2 (L) 26.0 - 34.0 pg   MCHC 34.8 32.0 - 36.0 g/dL   RDW 10.217.3 (H) 72.511.5 - 36.614.5 %   Platelets 185 150 -  440 K/uL  OB RESULTS CONSOLE GC/Chlamydia     Status: None   Collection Time: 01/30/16 12:00 AM  Result Value Ref Range   Gonorrhea Negative    Chlamydia Negative     Assessment:   21 y.o. G1P0 4065w3d IOL oligohydramnios  Plan:   1) Labor - start pitocin  2) Fetus - cat I tracing

## 2016-01-30 NOTE — Progress Notes (Signed)
Subjective:  Doing well, increasing strength to contractions  Objective:   Vitals: Blood pressure 125/75, pulse 81, temperature 98.3 F (36.8 C), temperature source Oral, resp. rate 18, height 5\' 3"  (1.6 m), weight 186 lb (84.4 kg), last menstrual period 04/22/2015. General: NAD Abdomen: gravid non-tender Cervical Exam:  Dilation: Fingertip Effacement (%): 60 Cervical Position: Posterior Presentation: Vertex Exam by:: AS, MD  FHT: 130, moderate, +Accels, no decels Toco: q2-553min  Results for orders placed or performed during the hospital encounter of 01/29/16 (from the past 24 hour(s))  Type and screen [facility]     Status: None   Collection Time: 01/29/16  9:35 PM  Result Value Ref Range   ABO/RH(D) B POS    Antibody Screen NEG    Sample Expiration 02/01/2016   Chlamydia/NGC rt PCR ([facility] only)     Status: None   Collection Time: 01/29/16  9:35 PM  Result Value Ref Range   Specimen source GC/Chlam ENDOCERVICAL    Chlamydia Tr NOT DETECTED NOT DETECTED   N gonorrhoeae NOT DETECTED NOT DETECTED  Urine Drug Screen, Qualitative ([facility] only)     Status: None   Collection Time: 01/29/16  9:35 PM  Result Value Ref Range   Tricyclic, Ur Screen NONE DETECTED NONE DETECTED   Amphetamines, Ur Screen NONE DETECTED NONE DETECTED   MDMA (Ecstasy)Ur Screen NONE DETECTED NONE DETECTED   Cocaine Metabolite,Ur [place] NONE DETECTED NONE DETECTED   Opiate, Ur Screen NONE DETECTED NONE DETECTED   Phencyclidine (PCP) Ur S NONE DETECTED NONE DETECTED   Cannabinoid 50 Ng, Ur [place] NONE DETECTED NONE DETECTED   Barbiturates, Ur Screen NONE DETECTED NONE DETECTED   Benzodiazepine, Ur Scrn NONE DETECTED NONE DETECTED   Methadone Scn, Ur NONE DETECTED NONE DETECTED  CBC     Status: Abnormal   Collection Time: 01/29/16 10:13 PM  Result Value Ref Range   WBC 10.7 3.6 - 11.0 K/uL   RBC 4.13 3.80 - 5.20 MIL/uL   Hemoglobin 10.4 (L) 12.0 - 16.0 g/dL   HCT 60.429.9 (L) 54.035.0 - 98.147.0 %   MCV  72.5 (L) 80.0 - 100.0 fL   MCH 25.2 (L) 26.0 - 34.0 pg   MCHC 34.8 32.0 - 36.0 g/dL   RDW 19.117.3 (H) 47.811.5 - 29.514.5 %   Platelets 185 150 - 440 K/uL  OB RESULTS CONSOLE GC/Chlamydia     Status: None   Collection Time: 01/30/16 12:00 AM  Result Value Ref Range   Gonorrhea Negative    Chlamydia Negative     Assessment:   21 y.o. G1P0 5735w3d IOL oligohydramnios  Plan:   1) Labor - continue pitocin, will re-attempt foley placement once slightly more dilated  2) Fetus - cat I tracing

## 2016-01-31 ENCOUNTER — Inpatient Hospital Stay: Payer: 59 | Admitting: Registered Nurse

## 2016-01-31 LAB — CBC
HEMATOCRIT: 32.3 % — AB (ref 35.0–47.0)
HEMOGLOBIN: 10.8 g/dL — AB (ref 12.0–16.0)
MCH: 24.5 pg — ABNORMAL LOW (ref 26.0–34.0)
MCHC: 33.4 g/dL (ref 32.0–36.0)
MCV: 73.3 fL — AB (ref 80.0–100.0)
Platelets: 192 10*3/uL (ref 150–440)
RBC: 4.41 MIL/uL (ref 3.80–5.20)
RDW: 17.4 % — AB (ref 11.5–14.5)
WBC: 17.6 10*3/uL — AB (ref 3.6–11.0)

## 2016-01-31 LAB — RPR: RPR: NONREACTIVE

## 2016-01-31 MED ORDER — FENTANYL 2.5 MCG/ML W/ROPIVACAINE 0.2% IN NS 100 ML EPIDURAL INFUSION (ARMC-ANES)
EPIDURAL | Status: DC | PRN
  Administered 2016-01-31: 10 mL/h via EPIDURAL
  Administered 2016-02-01 (×2): 250 ug via EPIDURAL

## 2016-01-31 MED ORDER — BUPIVACAINE HCL (PF) 0.25 % IJ SOLN
INTRAMUSCULAR | Status: DC | PRN
  Administered 2016-01-31 (×2): 5 mL via EPIDURAL

## 2016-01-31 MED ORDER — BUTORPHANOL TARTRATE 1 MG/ML IJ SOLN
1.0000 mg | INTRAMUSCULAR | Status: DC | PRN
  Administered 2016-01-31: 2 mg via INTRAVENOUS

## 2016-01-31 MED ORDER — LIDOCAINE HCL (PF) 1 % IJ SOLN
INTRAMUSCULAR | Status: DC | PRN
  Administered 2016-01-31: 3 mL

## 2016-01-31 MED ORDER — LIDOCAINE-EPINEPHRINE (PF) 1.5 %-1:200000 IJ SOLN
INTRAMUSCULAR | Status: DC | PRN
  Administered 2016-01-31: 3 mL via PERINEURAL

## 2016-01-31 MED ORDER — BUTORPHANOL TARTRATE 1 MG/ML IJ SOLN
INTRAMUSCULAR | Status: AC
  Administered 2016-01-31: 2 mg via INTRAVENOUS
  Filled 2016-01-31: qty 2

## 2016-01-31 MED ORDER — FENTANYL 2.5 MCG/ML W/ROPIVACAINE 0.2% IN NS 100 ML EPIDURAL INFUSION (ARMC-ANES)
EPIDURAL | Status: AC
  Filled 2016-01-31: qty 100

## 2016-01-31 NOTE — Progress Notes (Signed)
CBC drawn via left AC, sent to lab - anesthesia wants current CBC before placing epidural.  Called lab earlier, no tech arrived on the unit; RN draw CBC, sent to lab.

## 2016-01-31 NOTE — Anesthesia Preprocedure Evaluation (Signed)
Anesthesia Evaluation  Patient identified by MRN, date of birth, ID band Patient awake    Reviewed: Allergy & Precautions, H&P , NPO status , Patient's Chart, lab work & pertinent test results  History of Anesthesia Complications Negative for: history of anesthetic complications  Airway Mallampati: II  TM Distance: >3 FB Neck ROM: full    Dental no notable dental hx.    Pulmonary neg pulmonary ROS, former smoker,    Pulmonary exam normal        Cardiovascular negative cardio ROS Normal cardiovascular exam     Neuro/Psych PSYCHIATRIC DISORDERS Depressionnegative neurological ROS     GI/Hepatic Neg liver ROS,   Endo/Other  negative endocrine ROS  Renal/GU negative Renal ROS  negative genitourinary   Musculoskeletal   Abdominal   Peds  Hematology  (+) anemia ,   Anesthesia Other Findings   Reproductive/Obstetrics (+) Pregnancy                            Anesthesia Physical Anesthesia Plan  ASA: II  Anesthesia Plan: Epidural   Post-op Pain Management:    Induction:   Airway Management Planned:   Additional Equipment:   Intra-op Plan:   Post-operative Plan:   Informed Consent: I have reviewed the patients History and Physical, chart, labs and discussed the procedure including the risks, benefits and alternatives for the proposed anesthesia with the patient or authorized representative who has indicated his/her understanding and acceptance.     Plan Discussed with: Anesthesiologist  Anesthesia Plan Comments:         Anesthesia Quick Evaluation

## 2016-01-31 NOTE — Anesthesia Procedure Notes (Signed)
Epidural Patient location during procedure: OB Start time: 01/31/2016 3:53 PM End time: 01/31/2016 3:57 PM  Staffing Anesthesiologist: Yevette EdwardsADAMS, JAMES G Resident/CRNA: Irving BurtonBACHICH, JENNIFER Performed: resident/CRNA   Preanesthetic Checklist Completed: patient identified, site marked, surgical consent, pre-op evaluation, timeout performed, IV checked, risks and benefits discussed and monitors and equipment checked  Epidural Patient position: sitting Prep: Betadine Patient monitoring: heart rate, continuous pulse ox and blood pressure Approach: midline Location: L3-L4 Injection technique: LOR saline  Needle:  Needle type: Tuohy  Needle gauge: 17 G Needle length: 9 cm and 9 Needle insertion depth: 5.5 cm Catheter type: closed end flexible Catheter size: 19 Gauge Catheter at skin depth: 10 cm Test dose: negative and 1.5% lidocaine with Epi 1:200 K  Assessment Sensory level: T10 Events: blood not aspirated, injection not painful, no injection resistance, negative IV test and no paresthesia  Additional Notes Pt. Evaluated and documentation done after procedure finished. Patient identified. Risks/Benefits/Options discussed with patient including but not limited to bleeding, infection, nerve damage, paralysis, failed block, incomplete pain control, headache, blood pressure changes, nausea, vomiting, reactions to medication both or allergic, itching and postpartum back pain. Confirmed with bedside nurse the patient's most recent platelet count. Confirmed with patient that they are not currently taking any anticoagulation, have any bleeding history or any family history of bleeding disorders. Patient expressed understanding and wished to proceed. All questions were answered. Sterile technique was used throughout the entire procedure. Please see nursing notes for vital signs. Test dose was given through epidural catheter and negative prior to continuing to dose epidural or start infusion. Warning  signs of high block given to the patient including shortness of breath, tingling/numbness in hands, complete motor block, or any concerning symptoms with instructions to call for help. Patient was given instructions on fall risk and not to get out of bed. All questions and concerns addressed with instructions to call with any issues or inadequate analgesia.   Patient tolerated the insertion well without immediate complications.Reason for block:procedure for pain

## 2016-01-31 NOTE — Progress Notes (Signed)
Gave Dr. Jean RosenthalJackson an update on patient. Verbal order to increase pitocin by 1 Q2530mins instead of 2.

## 2016-01-31 NOTE — Progress Notes (Addendum)
L&D Note  Day 3 of induction for oligohydraminos  S: c/o painful contractions. Had Stadol a few hours ago. Does not want epidural.  O: had some mild ramge blood pressure elevations during the night. BP 135/81 (BP Location: Left Arm)   Pulse 83   Temp 98.1 F (36.7 C) (Oral)   Resp 18   Ht 5\' 3"  (1.6 m)   Wt 186 lb (84.4 kg)   LMP 04/22/2015 (LMP Unknown)   BMI 32.95 kg/m  SROM at 0245 clear fluid FHR: 125 with accelrations to 140s, moderate variability Toco: q2-3 min apart with Pitoci at 3818miu/min Cervix: 1.5/80-90%/-1 to 0. Bloody show present  A: IUP at 40wkm 4 d with oligo, induction in progress P: N2O2 offered for analgesia and patient accepts Continue Pitocin induction/augmentation  Latunya Kissick, CNM

## 2016-02-01 ENCOUNTER — Encounter: Payer: Self-pay | Admitting: *Deleted

## 2016-02-01 MED ORDER — OXYCODONE-ACETAMINOPHEN 5-325 MG PO TABS: 2.0000 | ORAL_TABLET | ORAL | Status: DC | PRN

## 2016-02-01 MED ORDER — COCONUT OIL OIL
1.0000 "application " | TOPICAL_OIL | Status: DC | PRN
  Filled 2016-02-01: qty 120

## 2016-02-01 MED ORDER — ONDANSETRON HCL 4 MG PO TABS
4.0000 mg | ORAL_TABLET | ORAL | Status: DC | PRN
Start: 2016-02-01 — End: 2016-02-03

## 2016-02-01 MED ORDER — SODIUM CHLORIDE 0.9% FLUSH: 3.0000 mL | Freq: Two times a day (BID) | INTRAVENOUS | Status: DC

## 2016-02-01 MED ORDER — ACETAMINOPHEN 325 MG PO TABS: 650.0000 mg | ORAL_TABLET | ORAL | Status: DC | PRN

## 2016-02-01 MED ORDER — DIBUCAINE 1 % RE OINT: 1.0000 "application " | TOPICAL_OINTMENT | RECTAL | Status: DC | PRN

## 2016-02-01 MED ORDER — ONDANSETRON HCL 4 MG/2ML IJ SOLN: 4.0000 mg | INTRAMUSCULAR | Status: DC | PRN

## 2016-02-01 MED ORDER — SIMETHICONE 80 MG PO CHEW: 80.0000 mg | CHEWABLE_TABLET | ORAL | Status: DC | PRN

## 2016-02-01 MED ORDER — WITCH HAZEL-GLYCERIN EX PADS: 1.0000 "application " | MEDICATED_PAD | CUTANEOUS | Status: DC | PRN

## 2016-02-01 MED ORDER — BENZOCAINE-MENTHOL 20-0.5 % EX AERO: 1.0000 "application " | INHALATION_SPRAY | CUTANEOUS | Status: DC | PRN

## 2016-02-01 MED ORDER — IBUPROFEN 600 MG PO TABS
600.0000 mg | ORAL_TABLET | Freq: Four times a day (QID) | ORAL | Status: DC
  Administered 2016-02-01 – 2016-02-03 (×8): 600 mg via ORAL
  Filled 2016-02-01 (×8): qty 1

## 2016-02-01 MED ORDER — ZOLPIDEM TARTRATE 5 MG PO TABS: 5.0000 mg | ORAL_TABLET | Freq: Every evening | ORAL | Status: DC | PRN

## 2016-02-01 MED ORDER — OXYTOCIN 40 UNITS IN LACTATED RINGERS INFUSION - SIMPLE MED
INTRAVENOUS | Status: AC
  Filled 2016-02-01: qty 1000

## 2016-02-01 MED ORDER — SODIUM CHLORIDE 0.9% FLUSH: 3.0000 mL | INTRAVENOUS | Status: DC | PRN

## 2016-02-01 MED ORDER — FENTANYL 2.5 MCG/ML W/ROPIVACAINE 0.2% IN NS 100 ML EPIDURAL INFUSION (ARMC-ANES)
EPIDURAL | Status: AC
  Administered 2016-02-01: 250 ug
  Filled 2016-02-01: qty 100

## 2016-02-01 MED ORDER — DIPHENHYDRAMINE HCL 25 MG PO CAPS: 25.0000 mg | ORAL_CAPSULE | Freq: Four times a day (QID) | ORAL | Status: DC | PRN

## 2016-02-01 MED ORDER — OXYCODONE-ACETAMINOPHEN 5-325 MG PO TABS
1.0000 | ORAL_TABLET | ORAL | Status: DC | PRN
  Administered 2016-02-01 – 2016-02-03 (×2): 1 via ORAL
  Filled 2016-02-01 (×2): qty 1

## 2016-02-01 MED ORDER — SENNOSIDES-DOCUSATE SODIUM 8.6-50 MG PO TABS: 2.0000 | ORAL_TABLET | ORAL | Status: DC

## 2016-02-01 MED ORDER — SODIUM CHLORIDE 0.9 % IV SOLN: 250.0000 mL | INTRAVENOUS | Status: DC | PRN

## 2016-02-01 NOTE — Discharge Instructions (Signed)

## 2016-02-01 NOTE — Progress Notes (Signed)
Called Dr. Pernell DupreAdams at [phone] in regards to patient's pain with contractions rating a 7 out of 10. Per verbal order increase epidural rate to 20 for an hour then change rate to 12 thereafter. Epidural rate currently at 20, will reassess patient's pain.

## 2016-02-01 NOTE — Progress Notes (Addendum)
RN to the bedside to assist patient with ambulating to BR. Pt. Voided in BR, ambulation steady, nurse assist with pericare, fresh ice pack put in place, pt. Voided mod. Amount. Yellow urine with lochia.  Pt. Assisted to bedside chair, pt. preference, currently ready for transfer to postpartum unit for postpartum care.  Pt. tolerated regular diet one hour ago..no emesis reported.  [facility], [facility] and [facility]

## 2016-02-01 NOTE — Discharge Summary (Signed)
Obstetrical Discharge Summary  Date of Admission: 01/29/2016 Date of Discharge: 02/03/16  Discharge Diagnosis: Term Pregnancy-delivered Primary OB:  [facility]   Gestational Age at Delivery: 2782w5d  Antepartum complications: infection Date of Delivery: 02/03/2016  Delivery Note Primary OB: [facility] Delivery Physician: Annamarie MajorPaul Harris, MD Gestational Age: Full term Antepartum complications: infection and (this was chlamydia, treated) Intrapartum complications: None  A viable Female was delivered via vertex perentation.  Apgars:8 ,9  Weight:  6 lb 4 oz .   Placenta status: manual removal and Intact.  Cord: 3+ vessels;  with the following complications: nuchal.  Anesthesia:  epidural Episiotomy:  none Lacerations:  periuretheral Suture Repair: 2.0 vicryl Est. Blood Loss (mL):  200 mL  Mom to postpartum.  Baby to Couplet care / Skin to Skin.   Post partum course: Since the delivery, patient has tolerate activity, diet, and daily functions without difficulty or complication.  Min lochia.  No breast concerns at this time.  No signs of depression currently.   Postpartum Exam:General appearance: alert, appears stated age and no distress GI: Fundus firm Extremities: extremities normal, atraumatic, no cyanosis or edema  Disposition: home with infant Rh Immune globulin given: not applicable Rubella vaccine given: not applicable Varicella vaccine given: not applicable Tdap vaccine given in AP or PP setting: given during prenatal care Flu vaccine given in AP or PP setting: given during prenatal care Contraception: to be determined at post partum visit  Prenatal Labs: B POS//Rubella Immune//RPR negative//HIV negative/HepB Surface Ag negative//plans to breastfeed  Plan:  Carrie Frazier was discharged to home in good condition. [facility]  Discharge Medications: Allergies as of 02/03/2016   No Known Allergies     Medication List    TAKE  these medications   ibuprofen 600 MG tablet Commonly known as:  ADVIL,MOTRIN Take 1 tablet (600 mg total) by mouth every 6 (six) hours.   multivitamin-prenatal 27-0.8 MG Tabs tablet Take 1 tablet by mouth daily at 12 noon.

## 2016-02-02 LAB — CBC
HCT: 25.4 % — ABNORMAL LOW (ref 35.0–47.0)
Hemoglobin: 8.5 g/dL — ABNORMAL LOW (ref 12.0–16.0)
MCH: 24.8 pg — ABNORMAL LOW (ref 26.0–34.0)
MCHC: 33.3 g/dL (ref 32.0–36.0)
MCV: 74.5 fL — ABNORMAL LOW (ref 80.0–100.0)
Platelets: 159 10*3/uL (ref 150–440)
RBC: 3.4 MIL/uL — ABNORMAL LOW (ref 3.80–5.20)
RDW: 17.4 % — AB (ref 11.5–14.5)
WBC: 11.9 10*3/uL — ABNORMAL HIGH (ref 3.6–11.0)

## 2016-02-02 NOTE — Progress Notes (Signed)
  Subjective:  Doing well no concerns, minimal bleeding.    Objective:   Blood pressure (!) 100/46, pulse 71, temperature 97.8 F (36.6 C), temperature source Oral, resp. rate 19, height 5\' 3"  (1.6 m), weight 186 lb (84.4 kg), last menstrual period 04/22/2015, SpO2 98 %, unknown if currently breastfeeding.  General: NAD Pulmonary: no increased work of breathing Abdomen: non-distended, non-tender, fundus firm at level of umbilicus Extremities: no edema, no erythema, no tenderness  Results for orders placed or performed during the hospital encounter of 01/29/16 (from the past 72 hour(s))  CBC     Status: Abnormal   Collection Time: 01/31/16  2:57 PM  Result Value Ref Range   WBC 17.6 (H) 3.6 - 11.0 K/uL   RBC 4.41 3.80 - 5.20 MIL/uL   Hemoglobin 10.8 (L) 12.0 - 16.0 g/dL   HCT 09.832.3 (L) 11.935.0 - 14.747.0 %   MCV 73.3 (L) 80.0 - 100.0 fL   MCH 24.5 (L) 26.0 - 34.0 pg   MCHC 33.4 32.0 - 36.0 g/dL   RDW 82.917.4 (H) 56.211.5 - 13.014.5 %   Platelets 192 150 - 440 K/uL  CBC     Status: Abnormal   Collection Time: 02/02/16  6:57 AM  Result Value Ref Range   WBC 11.9 (H) 3.6 - 11.0 K/uL   RBC 3.40 (L) 3.80 - 5.20 MIL/uL   Hemoglobin 8.5 (L) 12.0 - 16.0 g/dL   HCT 86.525.4 (L) 78.435.0 - 69.647.0 %   MCV 74.5 (L) 80.0 - 100.0 fL   MCH 24.8 (L) 26.0 - 34.0 pg   MCHC 33.3 32.0 - 36.0 g/dL   RDW 29.517.4 (H) 28.411.5 - 13.214.5 %   Platelets 159 150 - 440 K/uL    Assessment:   20 y.o. G1P1001 postpartum day # 1 TSVD  Plan:    1) Acute blood loss anemia - hemodynamically stable and asymptomatic - po ferrous sulfate  2) --/--/B POS (01/23 2135) / Rubella  Immune (06/22 0000) / Varicella Immune  3) TDAP needs  4) Breast/ undecided  5) Disposition anticipate discharge PPD2

## 2016-02-02 NOTE — Anesthesia Postprocedure Evaluation (Signed)
Anesthesia Post Note  Patient: Carrie Frazier  Procedure(s) Performed: * No procedures listed *  Patient location during evaluation: Mother Baby Anesthesia Type: Epidural Level of consciousness: awake and alert and oriented Pain management: pain level controlled Vital Signs Assessment: post-procedure vital signs reviewed and stable Respiratory status: spontaneous breathing, nonlabored ventilation and respiratory function stable Cardiovascular status: stable Postop Assessment: no headache, no backache, epidural receding and no signs of nausea or vomiting Anesthetic complications: no     Last Vitals:  Vitals:   02/02/16 0349 02/02/16 0725  BP: 121/61 (!) 100/46  Pulse: 65 71  Resp: 18 19  Temp: 36.7 C 36.6 C    Last Pain:  Vitals:   02/02/16 0800  TempSrc:   PainSc: 0-No pain                 Sheryl Saintil

## 2016-02-03 MED ORDER — IBUPROFEN 600 MG PO TABS: 600.0000 mg | ORAL_TABLET | Freq: Four times a day (QID) | ORAL | 0 refills | Status: DC

## 2016-02-03 NOTE — Progress Notes (Signed)
D/C instructions provided, pt states understanding, aware of follow up appt. Prescriptions given to pt.   

## 2016-02-03 NOTE — Progress Notes (Signed)
Pt refuses TDAP.

## 2016-02-03 NOTE — Progress Notes (Signed)
D/C home to car via auxiliary in wheelchair.  

## 2016-02-13 NOTE — Addendum Note (Signed)
Addendum  created 02/13/16 [id number] by Yevette EdwardsJames G Niasia Lanphear, MD   Anesthesia Attestations filed

## 2016-03-25 ENCOUNTER — Encounter: Payer: Self-pay | Admitting: Obstetrics & Gynecology

## 2016-03-27 ENCOUNTER — Encounter: Payer: Self-pay | Admitting: Obstetrics & Gynecology

## 2016-03-27 ENCOUNTER — Ambulatory Visit: Payer: Self-pay | Admitting: Obstetrics & Gynecology

## 2016-10-13 ENCOUNTER — Encounter: Payer: Self-pay | Admitting: Obstetrics and Gynecology

## 2016-11-04 ENCOUNTER — Encounter: Payer: Medicaid Other | Admitting: Advanced Practice Midwife

## 2016-11-06 ENCOUNTER — Encounter: Payer: Self-pay | Admitting: Emergency Medicine

## 2016-11-06 ENCOUNTER — Emergency Department
Admission: EM | Admit: 2016-11-06 | Discharge: 2016-11-06 | Disposition: A | Discharge status: Home / Self Care | Payer: 59 | Attending: Emergency Medicine | Admitting: Emergency Medicine

## 2016-11-06 DIAGNOSIS — R109 Unspecified abdominal pain: Secondary | ICD-10-CM | POA: Insufficient documentation

## 2016-11-06 DIAGNOSIS — O26899 Other specified pregnancy related conditions, unspecified trimester: Secondary | ICD-10-CM

## 2016-11-06 DIAGNOSIS — Z791 Long term (current) use of non-steroidal anti-inflammatories (NSAID): Secondary | ICD-10-CM | POA: Insufficient documentation

## 2016-11-06 DIAGNOSIS — O9989 Other specified diseases and conditions complicating pregnancy, childbirth and the puerperium: Secondary | ICD-10-CM | POA: Insufficient documentation

## 2016-11-06 DIAGNOSIS — Z79899 Other long term (current) drug therapy: Secondary | ICD-10-CM | POA: Insufficient documentation

## 2016-11-06 DIAGNOSIS — F17200 Nicotine dependence, unspecified, uncomplicated: Secondary | ICD-10-CM | POA: Insufficient documentation

## 2016-11-06 DIAGNOSIS — O99331 Smoking (tobacco) complicating pregnancy, first trimester: Secondary | ICD-10-CM | POA: Insufficient documentation

## 2016-11-06 DIAGNOSIS — Z3A01 Less than 8 weeks gestation of pregnancy: Secondary | ICD-10-CM | POA: Diagnosis not present

## 2016-11-06 LAB — CBC
HEMATOCRIT: 35.6 % (ref 35.0–47.0)
HEMOGLOBIN: 12.4 g/dL (ref 12.0–16.0)
MCH: 26.4 pg (ref 26.0–34.0)
MCHC: 34.9 g/dL (ref 32.0–36.0)
MCV: 75.5 fL — AB (ref 80.0–100.0)
Platelets: 269 10*3/uL (ref 150–440)
RBC: 4.71 MIL/uL (ref 3.80–5.20)
RDW: 16.5 % — ABNORMAL HIGH (ref 11.5–14.5)
WBC: 7.6 10*3/uL (ref 3.6–11.0)

## 2016-11-06 LAB — LIPASE, BLOOD: LIPASE: 22 U/L (ref 11–51)

## 2016-11-06 LAB — URINALYSIS, COMPLETE (UACMP) WITH MICROSCOPIC
BACTERIA UA: NONE SEEN
BILIRUBIN URINE: NEGATIVE
Glucose, UA: NEGATIVE mg/dL
Hgb urine dipstick: NEGATIVE
KETONES UR: NEGATIVE mg/dL
LEUKOCYTES UA: NEGATIVE
NITRITE: NEGATIVE
PROTEIN: NEGATIVE mg/dL
RBC / HPF: NONE SEEN RBC/hpf (ref 0–5)
Specific Gravity, Urine: 1.025 (ref 1.005–1.030)
WBC UA: NONE SEEN WBC/hpf (ref 0–5)
pH: 7.5 (ref 5.0–8.0)

## 2016-11-06 LAB — COMPREHENSIVE METABOLIC PANEL
ALT: 11 U/L — ABNORMAL LOW (ref 14–54)
ANION GAP: 7 (ref 5–15)
AST: 14 U/L — AB (ref 15–41)
Albumin: 3.9 g/dL (ref 3.5–5.0)
Alkaline Phosphatase: 68 U/L (ref 38–126)
BUN: 9 mg/dL (ref 6–20)
CHLORIDE: 102 mmol/L (ref 101–111)
CO2: 25 mmol/L (ref 22–32)
Calcium: 9 mg/dL (ref 8.9–10.3)
Creatinine, Ser: 0.51 mg/dL (ref 0.44–1.00)
Glucose, Bld: 90 mg/dL (ref 65–99)
POTASSIUM: 3.6 mmol/L (ref 3.5–5.1)
Sodium: 134 mmol/L — ABNORMAL LOW (ref 135–145)
TOTAL PROTEIN: 7.5 g/dL (ref 6.5–8.1)
Total Bilirubin: 0.8 mg/dL (ref 0.3–1.2)

## 2016-11-06 LAB — HCG, QUANTITATIVE, PREGNANCY: hCG, Beta Chain, Quant, S: 79463 m[IU]/mL — ABNORMAL HIGH (ref <5)

## 2016-11-06 NOTE — ED Notes (Signed)
This RN went in to discharge patient and found room empty. Went to lobby and was unable to locate patient. MD states he went over discharge instructions and follow-up care with patient upon final assessment. Unable to obtain discharge vitals or signature.

## 2016-11-06 NOTE — ED Provider Notes (Signed)
[facility] Emergency Department Provider Note   ____________________________________________   I have reviewed the triage vital signs and the nursing notes.   HISTORY  Chief Complaint Abdominal Pain   History limited by: Not Limited   HPI Carrie Frazier is a 21 y.o. female who presents to the emergency department today because of abdominal pain.   LOCATION:suprapubic DURATION:~ 3 weeks TIMING: waxing and waning SEVERITY: severe QUALITY: cramping CONTEXT: patient believes she is roughly 2 months pregnant based on home pregnancy test done two months ago.  MODIFYING FACTORS: none ASSOCIATED SYMPTOMS: denies any vaginal bleeding. Has had some nausea and vomiting. No fevers.  Per medical record review patient has a history of delivery earlier this year.  Past Medical History:  Diagnosis Date  . Anemia affecting pregnancy   . Depression   . 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 History of chlamydia     Patient Active Problem List   Diagnosis Date Noted  . Indication for care in labor or delivery 01/29/2016  . Oligohydramnios 01/29/2016    Past Surgical History:  Procedure Laterality Date  . NO PAST SURGERIES      Prior to Admission medications   Medication Sig Start Date End Date Taking? Authorizing Provider  ibuprofen (ADVIL,MOTRIN) 600 MG tablet Take 1 tablet (600 mg total) by mouth every 6 (six) hours. 02/03/16   Vena AustriaStaebler, Andreas, MD  Prenatal Vit-Fe Fumarate-FA (MULTIVITAMIN-PRENATAL) 27-0.8 MG TABS tablet Take 1 tablet by mouth daily at 12 noon.    [provider]    Allergies Patient has no known allergies.  No family history on file.  Social History Social History  Substance Use Topics  . Smoking status: Former Games developermoker  . Smokeless tobacco: Never Used  . Alcohol use Yes    Review of Systems Constitutional: No fever/chills Eyes: No visual changes. ENT: No sore throat. Cardiovascular: Denies chest  pain. Respiratory: Denies shortness of breath. Gastrointestinal: Positive for abdominal pain, nausea and vomiting. Genitourinary: Negative for dysuria. Musculoskeletal: Negative for back pain. Skin: Negative for rash. Neurological: Negative for headaches, focal weakness or numbness.  ____________________________________________   PHYSICAL EXAM:  VITAL SIGNS: ED Triage Vitals  Enc Vitals Group     BP 11/06/16 1100 134/90     Pulse Rate 11/06/16 1100 (!) 105     Resp 11/06/16 1100 18     Temp 11/06/16 1100 98 F (36.7 C)     Temp Source 11/06/16 1100 Oral     SpO2 11/06/16 1100 100 %     Weight 11/06/16 1100 122 lb (55.3 kg)     Height 11/06/16 1100 5' 3.5" (1.613 m)     Head Circumference --      Peak Flow --      Pain Score 11/06/16 1059 10   Constitutional: Alert and oriented. Well appearing and in no distress. Eyes: Conjunctivae are normal.  ENT   Head: Normocephalic and atraumatic.   Nose: No congestion/rhinnorhea.   Mouth/Throat: Mucous membranes are moist.   Neck: No stridor. Hematological/Lymphatic/Immunilogical: No cervical lymphadenopathy. Cardiovascular: Normal rate, regular rhythm.  No murmurs, rubs, or gallops.  Respiratory: Normal respiratory effort without tachypnea nor retractions. Breath sounds are clear and equal bilaterally. No wheezes/rales/rhonchi. Gastrointestinal: Soft and non tender. No rebound. No guarding.  Genitourinary: Deferred Musculoskeletal: Normal range of motion in all extremities. No lower extremity edema. Neurologic:  Normal speech and language. No gross focal neurologic deficits are appreciated.  Skin:  Skin is warm, dry and intact. No  rash noted. Psychiatric: Mood and affect are normal. Speech and behavior are normal. Patient exhibits appropriate insight and judgment.  ____________________________________________    LABS (pertinent positives/negatives)  bHCG [phone] UA not consistent with infection CMP na 134, ast 14,  alt 11, otherwise wnl CBC wbc 7.6, hgb 12.4 ____________________________________________   EKG  None  ____________________________________________    RADIOLOGY  None  ____________________________________________   PROCEDURES  Procedures  ____________________________________________   INITIAL IMPRESSION / ASSESSMENT AND PLAN / ED COURSE  Pertinent labs & imaging results that were available during my care of the patient were reviewed by me and considered in my medical decision making (see chart for details).  Differential diagnosis includes, but is not limited to, ovarian cyst, ovarian torsion, acute appendicitis, diverticulitis, urinary tract infection/pyelonephritis, endometriosis, bowel obstruction, colitis, renal colic, gastroenteritis, hernia, pregnancy related pain including ectopic pregnancy, etc. Bedside US shows intrauterine pregnancy with good heart rate and fetal movement. No vaginal bleeding. No leukocytosis to suggest significant infection. At this point think likely pain secondary to pregnancy. Discussed this with the patient, discussed importance of follow up and prenatal vitamins.    ____________________________________________   FINAL CLINICAL IMPRESSION(S) / ED DIAGNOSES  Final diagnoses:  Abdominal pain during pregnancy, antepartum     Note: This dictation was prepared with Dragon dictation. Any transcriptional errors that result from this process are unintentional     Phineas Semen, MD 11/06/16 1311

## 2016-11-06 NOTE — ED Triage Notes (Signed)
Pt reports lower abdominal pain. Pt reports last period August 25, 2016 and she has had a positive pregnancy test at home. Pt denies seeing OB yet. Denies vaginal bleeding but states she doesn't know if the baby is still there. Pt reports vomiting which she has associated with pregnancy.

## 2016-11-06 NOTE — Discharge Instructions (Signed)
As discussed it is very important that you start taking prenatal vitamins. Please seek medical attention for any high fevers, chest pain, shortness of breath, change in behavior, persistent vomiting, bloody stool or any other new or concerning symptoms.

## 2016-11-18 ENCOUNTER — Encounter: Payer: Medicaid Other | Admitting: Obstetrics and Gynecology

## 2016-12-11 ENCOUNTER — Observation Stay
Admission: EM | Admit: 2016-12-11 | Discharge: 2016-12-11 | Disposition: A | Discharge status: Home / Self Care | Payer: 59 | Attending: Obstetrics and Gynecology | Admitting: Obstetrics and Gynecology

## 2016-12-11 DIAGNOSIS — Z87891 Personal history of nicotine dependence: Secondary | ICD-10-CM | POA: Insufficient documentation

## 2016-12-11 DIAGNOSIS — Z5321 Procedure and treatment not carried out due to patient leaving prior to being seen by health care provider: Secondary | ICD-10-CM | POA: Diagnosis not present

## 2016-12-11 DIAGNOSIS — Z3A00 Weeks of gestation of pregnancy not specified: Secondary | ICD-10-CM | POA: Insufficient documentation

## 2016-12-11 DIAGNOSIS — O9989 Other specified diseases and conditions complicating pregnancy, childbirth and the puerperium: Secondary | ICD-10-CM | POA: Diagnosis present

## 2016-12-11 DIAGNOSIS — O26899 Other specified pregnancy related conditions, unspecified trimester: Secondary | ICD-10-CM | POA: Diagnosis present

## 2016-12-11 DIAGNOSIS — R102 Pelvic and perineal pain: Secondary | ICD-10-CM | POA: Diagnosis not present

## 2016-12-11 NOTE — Progress Notes (Signed)
Patient ID: RUEAVWUJTiffiyah Blondell RevealNichele Frazier, female   DOB: 07/25/1995, 21 y.o.   MRN: [id number]   Carrie Frazier is a 21 y.o. female. She is at Unknown gestation. Patient's last menstrual period was 08/25/2016 (approximate).pt c/o bilateral lower pelvic pain  Estimated Date of Delivery: None noted.  [facility]  Chief complaint:  Location: Onset/timing: Duration: Quality:  Severity: Aggravating or alleviating conditions: Associated signs/symptoms: Context:  S: Resting comfortably. no CTX, no VB.no LOF,   Maternal Medical History:   Past Medical History:  Diagnosis Date  . Anemia affecting pregnancy   . Depression   . 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 History of chlamydia     Past Surgical History:  Procedure Laterality Date  . NO PAST SURGERIES      No Known Allergies  Prior to Admission medications   Medication Sig Start Date End Date Taking? Authorizing Provider  ibuprofen (ADVIL,MOTRIN) 600 MG tablet Take 1 tablet (600 mg total) by mouth every 6 (six) hours. Patient not taking: Reported on 11/06/2016 02/03/16   Vena AustriaStaebler, Andreas, MD  Prenatal Vit-Fe Fumarate-FA (MULTIVITAMIN-PRENATAL) 27-0.8 MG TABS tablet Take 1 tablet by mouth daily at 12 noon.    [provider]     Social History: She  reports that she has quit smoking. she has never used smokeless tobacco. She reports that she drinks alcohol. She reports that she uses drugs. Drug: Marijuana.  Family History: family history is not on file.  no history of gyn cancers  Review of Systems: A full review of systems was performed and negative except as noted in the HPI.     O:  LMP 08/25/2016 (Approximate)  No results found for this or any previous visit (from the past 48 hour(s)).   Constitutional: NAD, AAOx3  HE/ENT: extraocular movements grossly intact, moist mucous membranes CV: RRR PULM: nl respiratory effort, CTABL     Abd: gravid, non-tender, non-distended, soft      Ext: Non-tender,  Nonedmeatous   Psych: mood appropriate, speech normal Pelvic deferred  N   A/P: 21 y.o. Unknown here for antenatal surveillance for pelvic pain  Benign abd exam  Ordered dating u/s , but pt left AMA before eval in radiology  ----- Jennell Cornerhomas Schermerhorn, MD Attending Obstetrician and Gynecologist [facility], Department of OB/GYN [facility]

## 2016-12-11 NOTE — Discharge Summary (Signed)
Carrie Frazier, Carrie Austinhomas J, MD  Physician  Obstetrics  Progress Notes  Signed  Date of Service:  12/11/2016 2:30 PM          Signed      Expand All Collapse All      [] Hide copied text  [] Hover for details   Patient ID: Carrie Frazier, female   DOB: [birth year], 21 y.o.   MRN: [id number]   Subjective   Carrie Frazier is a 21 y.o. female. She is at Unknown gestation. Patient's last menstrual period was 08/25/2016 (approximate).pt c/o bilateral lower pelvic pain  Estimated Date of Delivery: None noted.  [facility]  Chief complaint:  Location: Onset/timing: Duration: Quality:  Severity: Aggravating or alleviating conditions: Associated signs/symptoms: Context:  S: Resting comfortably. noCTX, no VB.no LOF,   Maternal Medical History:       Past Medical History:  Diagnosis Date  . Anemia affecting pregnancy   . Depression   . 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 History of chlamydia          Past Surgical History:  Procedure Laterality Date  . NO PAST SURGERIES      No Known Allergies         Prior to Admission medications   Medication Sig Start Date End Date Taking? Authorizing Provider  ibuprofen (ADVIL,MOTRIN) 600 MG tablet Take 1 tablet (600 mg total) by mouth every 6 (six) hours. Patient not taking: Reported on 11/06/2016 02/03/16   Vena AustriaStaebler, Andreas, MD  Prenatal Vit-Fe Fumarate-FA (MULTIVITAMIN-PRENATAL) 27-0.8 MG TABS tablet Take 1 tablet by mouth daily at 12 noon.    [provider]     Social History: She  reports that she has quit smoking. she has never used smokeless tobacco. She reports that she drinks alcohol. She reports that she uses drugs. Drug: Marijuana.  Family History: family history is not on file.  no history of gyn cancers  Review of Systems: A full review of systems was performed and negative except as noted in the HPI.     O:   Objective   LMP 08/25/2016  (Approximate)  No results found for this or any previous visit (from the past 48 hour(s)).   Constitutional: NAD, AAOx3  HE/ENT: extraocular movements grossly intact, moist mucous membranes CV: RRR PULM: nl respiratory effort, CTABL                                         Abd: gravid, non-tender, non-distended, soft                                                  Ext: Non-tender, Nonedmeatous                     Psych: mood appropriate, speech normal Pelvic deferred  N   A/P: 21 y.o. Unknown here for antenatal surveillance for pelvic pain  Benign abd exam  Ordered dating u/s , but pt left AMA before eval in radiology  ----- Carrie Cornerhomas Ahmiya Abee, MD Attending Obstetrician and Gynecologist [facility], Department of [facility]GYN [facility]           Electronically signed by Carrie Frazier, Carrie Austinhomas J, MD at 12/11/2016 4:30 PM  ED to Hosp-Admission (Discharged) on 12/11/2016

## 2016-12-11 NOTE — Progress Notes (Signed)
Pt signed out AMA "due to circumstances at home". Pt educated that it is not advised that she leave, but patient still states she wants to leave. Education provided and encouraged to follow up at the [facility] per MD. Provider notified. US notified.

## 2017-01-06 NOTE — L&D Delivery Note (Signed)
Delivery Note  Date of delivery: 06/17/2017 Estimated Date of Delivery: 06/19/17 Patient's last menstrual period was 08/25/2016 (lmp unknown). EGA: 3164w5d  First Stage: Labor onset: 1900 06/15/17 Augmentation: AROM Analgesia Eliezer Lofts/Anesthesia intrapartum: epidural AROM at 1805 06/16/17 for light meconium  Marguerite Nichele Smithson presented to L&D with contractions. She was augmented with AROM. Epidural placed, with 1 dose of IV Stadol given for pain relief prior to epidural placement.   Second Stage: Complete dilation at 2334 Onset of pushing at 2335 FHR second stage: category II for variable decelerations with contractions and pushing Delivery at 0046 on 06/17/2017  She progressed to complete and had a spontaneous vaginal birth of a live female over an intact perineum. The fetal head was delivered in DOA position with restitution to LOA. No nuchal cord. Anterior then posterior shoulders delivered with minimal assistance. Baby placed on mom's abdomen and attended to by peds NP and transition RN. Cord clamped and cut when after 60 second delay by father of the baby.   Third Stage: Placenta delivered manually after cord avulsion with controlled cord traction Placenta appear to be intact with 3VC, delivered at 0051 Placenta disposition: routine disposal Uterine tone firm / bleeding scant IV pitocin given for hemorrhage prophylyaxis  No lacerations identified  Anesthesia for repair: n/a Repair: n/a Est. Blood Loss (mL): 300  Complications: Cord avulsion with controlled cord traction. Placenta noted to be in posterior vagina at cervical opening. Manually removed with sweep of the vagina and lower uterine segment. Placenta appeared intact and cord appeared to have avulsed at site of marginal insertion on placenta. Small amount of clot with tiny amount of trailing membrane expressed with uterine massage. Bleeding scant.   Mom to postpartum.  Baby to Couplet care / Skin to Skin.  Newborn: Birth  Weight: pending  Apgar Scores: 8, 9 Feeding planned: breastfeeding   Genia DelMargaret Shanell Aden, CNM 06/17/2017 1:07 AM

## 2017-03-18 ENCOUNTER — Observation Stay
Admission: EM | Admit: 2017-03-18 | Discharge: 2017-03-18 | Disposition: A | Discharge status: Home / Self Care | Payer: 59 | Attending: Certified Nurse Midwife | Admitting: Certified Nurse Midwife

## 2017-03-18 DIAGNOSIS — R109 Unspecified abdominal pain: Secondary | ICD-10-CM | POA: Diagnosis present

## 2017-03-18 DIAGNOSIS — Z3A29 29 weeks gestation of pregnancy: Secondary | ICD-10-CM | POA: Insufficient documentation

## 2017-03-18 DIAGNOSIS — O26893 Other specified pregnancy related conditions, third trimester: Principal | ICD-10-CM | POA: Insufficient documentation

## 2017-03-18 DIAGNOSIS — Z87891 Personal history of nicotine dependence: Secondary | ICD-10-CM | POA: Diagnosis not present

## 2017-03-18 DIAGNOSIS — Z87898 Personal history of other specified conditions: Secondary | ICD-10-CM | POA: Diagnosis not present

## 2017-03-18 LAB — URINALYSIS, COMPLETE (UACMP) WITH MICROSCOPIC
BACTERIA UA: NONE SEEN
BILIRUBIN URINE: NEGATIVE
Glucose, UA: NEGATIVE mg/dL
HGB URINE DIPSTICK: NEGATIVE
KETONES UR: NEGATIVE mg/dL
LEUKOCYTES UA: NEGATIVE
Nitrite: NEGATIVE
PROTEIN: NEGATIVE mg/dL
SPECIFIC GRAVITY, URINE: 1.023 (ref 1.005–1.030)
pH: 6 (ref 5.0–8.0)

## 2017-03-18 MED ORDER — ACETAMINOPHEN 325 MG PO TABS
650.0000 mg | ORAL_TABLET | ORAL | Status: DC | PRN
  Administered 2017-03-18: 650 mg via ORAL
  Filled 2017-03-18: qty 2

## 2017-03-18 NOTE — OB Triage Note (Signed)
Carrie Frazier is a 22 y.o. female. She is at 4670w2d gestation. Patient's last menstrual period was 08/25/2016 (approximate). Estimated Date of Delivery: 06/01/17  [facility]   Chief complaint: abdominal pain  Location: b/l low abdomen and b/l sides Onset/timing: started a couple of weeks ago, feels on a daily basis Duration: constant Quality: sharp Severity: moderate Aggravating or alleviating conditions: none Associated signs/symptoms: low back pain, no dysuria, no diarrhea or constipation Context: Carrie Frazier reports that she has been having side and low abdominal pain for a couple of weeks. She reports that the pain is sharp and constant. Nothing seems to make it better or worse, though she is on her feet for long days at work. She has not tried any medication to help with the pain.   S: Resting comfortably. No vaginal bleeding, no leakage of fluid, no cramping/contractions. Active fetal movement.   Maternal Medical History:   Past Medical History:  Diagnosis Date  . Anemia affecting pregnancy   . Depression   . 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 History of chlamydia     Past Surgical History:  Procedure Laterality Date  . NO PAST SURGERIES      No Known Allergies  Prior to Admission medications   Medication Sig Start Date End Date Taking? Authorizing Provider  Prenatal Vit-Fe Fumarate-FA (MULTIVITAMIN-PRENATAL) 27-0.8 MG TABS tablet Take 1 tablet by mouth daily at 12 noon.   Yes [provider]  ibuprofen (ADVIL,MOTRIN) 600 MG tablet Take 1 tablet (600 mg total) by mouth every 6 (six) hours. Patient not taking: Reported on 11/06/2016 02/03/16   Vena AustriaStaebler, Andreas, MD     Social History: She  reports that she has quit smoking. she has never used smokeless tobacco. She reports that she drinks alcohol. She reports that she uses drugs. Drug: Marijuana.  Family History: family history is not on file.   Review of Systems: A full review  of systems was performed and negative except as noted in the HPI.    O:  BP 127/69   Pulse 86   Temp 98.3 F (36.8 C)   Resp 18   Ht 5' 3.5" (1.613 m)   Wt 69.9 kg (154 lb)   LMP 08/25/2016 (Approximate)   BMI 26.85 kg/m  No results found for this or any previous visit (from the past 48 hour(s)).   Constitutional: NAD, AAOx3  HE/ENT: extraocular movements grossly intact, moist mucous membranes CV: RRR PULM: nl respiratory effort, CTABL     Abd: gravid, mild tenderness, non-distended, soft      Ext: Non-tender, Nonedmeatous   Psych: mood appropriate, speech normal Pelvic: deferred  NST:  Baseline: 145bpm Variability: moderate Accelerations: 10x10 present x >2 Decelerations: absent Time: 30mins   A/P: 22 y.o. 2670w2d here for antenatal surveillance for abdominal pain  Labor: not present.   Fetal Wellbeing: reactive and reassuring NST  Reviewed round ligament pain and other normal MSK aches and pains of pregnancy. Advised Tylenol PRN, adequate hydration (64oz/day), and pregnancy support band.   UA pending as patient needed to leave with her ride.   D/c home stable, precautions reviewed, follow-up as scheduled in the office. Plan to get work restrictions note at office visit tomorrow.   ----- Genia DelMargaret Marieelena Bartko, CNM Certified Nurse Midwife [facility], Department of OB/GYN [facility], [facility]

## 2017-03-18 NOTE — Discharge Summary (Signed)
See OB Triage note  Genia DelMargaret Kadence Mimbs, [facility] 03/18/2017 9:38 PM

## 2017-03-18 NOTE — OB Triage Note (Signed)
Pt presents d/t lower abdominal pain, pain on her sides and tightening in her belly that has lasted several days. Pt reports that the pain has bee ongoing for several days, but got worse today. She says "I'm starting to think it's from my job. I stand on concrete all day."

## 2017-04-22 ENCOUNTER — Other Ambulatory Visit: Payer: Self-pay

## 2017-04-22 ENCOUNTER — Observation Stay
Admission: EM | Admit: 2017-04-22 | Discharge: 2017-04-22 | Disposition: A | Discharge status: Home / Self Care | Payer: 59 | Attending: Obstetrics & Gynecology | Admitting: Obstetrics & Gynecology

## 2017-04-22 DIAGNOSIS — Z8619 Personal history of other infectious and parasitic diseases: Secondary | ICD-10-CM | POA: Diagnosis not present

## 2017-04-22 DIAGNOSIS — Z3A31 31 weeks gestation of pregnancy: Secondary | ICD-10-CM | POA: Insufficient documentation

## 2017-04-22 DIAGNOSIS — D582 Other hemoglobinopathies: Secondary | ICD-10-CM | POA: Insufficient documentation

## 2017-04-22 DIAGNOSIS — R102 Pelvic and perineal pain: Secondary | ICD-10-CM | POA: Insufficient documentation

## 2017-04-22 DIAGNOSIS — O26893 Other specified pregnancy related conditions, third trimester: Principal | ICD-10-CM | POA: Insufficient documentation

## 2017-04-22 DIAGNOSIS — Z87891 Personal history of nicotine dependence: Secondary | ICD-10-CM | POA: Insufficient documentation

## 2017-04-22 LAB — URINALYSIS, COMPLETE (UACMP) WITH MICROSCOPIC
BILIRUBIN URINE: NEGATIVE
GLUCOSE, UA: NEGATIVE mg/dL
HGB URINE DIPSTICK: NEGATIVE
KETONES UR: NEGATIVE mg/dL
NITRITE: NEGATIVE
PH: 6 (ref 5.0–8.0)
PROTEIN: NEGATIVE mg/dL
Specific Gravity, Urine: 1.025 (ref 1.005–1.030)

## 2017-04-22 MED ORDER — ACETAMINOPHEN 500 MG PO TABS: 1000.0000 mg | ORAL_TABLET | Freq: Four times a day (QID) | ORAL | 2 refills | Status: DC | PRN

## 2017-04-22 MED ORDER — ACETAMINOPHEN 500 MG PO TABS
1000.0000 mg | ORAL_TABLET | Freq: Four times a day (QID) | ORAL | Status: DC | PRN
  Administered 2017-04-22: 1000 mg via ORAL
  Filled 2017-04-22: qty 2

## 2017-04-22 NOTE — OB Triage Note (Addendum)
Pt is a 31wk 5d G2P1 w/ a c/o of pelvic pain. Pt states the pain feels "sharp and sore" and started yesterday. Pt rates the pain a 10/10. Pt denies vaginal bleeding and LOF. Pt states positive fetal movement. Monitors applied and assessing.

## 2017-04-22 NOTE — Discharge Instructions (Signed)
Preterm Labor and Birth Information °Pregnancy normally lasts 39-41 weeks. Preterm labor is when labor starts early. It starts before you have been pregnant for 37 whole weeks. °What are the risk factors for preterm labor? °Preterm labor is more likely to occur in women who: °· Have an infection while pregnant. °· Have a cervix that is short. °· Have gone into preterm labor before. °· Have had surgery on their cervix. °· Are younger than age 23. °· Are older than age 35. °· Are African American. °· Are pregnant with two or more babies. °· Take street drugs while pregnant. °· Smoke while pregnant. °· Do not gain enough weight while pregnant. °· Got pregnant right after another pregnancy. ° °What are the symptoms of preterm labor? °Symptoms of preterm labor include: °· Cramps. The cramps may feel like the cramps some women get during their period. The cramps may happen with watery poop (diarrhea). °· Pain in the belly (abdomen). °· Pain in the lower back. °· Regular contractions or tightening. It may feel like your belly is getting tighter. °· Pressure in the lower belly that seems to get stronger. °· More fluid (discharge) leaking from the vagina. The fluid may be watery or bloody. °· Water breaking. ° °Why is it important to notice signs of preterm labor? °Babies who are born early may not be fully developed. They have a higher chance for: °· Long-term heart problems. °· Long-term lung problems. °· Trouble controlling body systems, like breathing. °· Bleeding in the brain. °· A condition called cerebral palsy. °· Learning difficulties. °· Death. ° °These risks are highest for babies who are born before 34 weeks of pregnancy. °How is preterm labor treated? °Treatment depends on: °· How long you were pregnant. °· Your condition. °· The health of your baby. ° °Treatment may involve: °· Having a stitch (suture) placed in your cervix. When you give birth, your cervix opens so the baby can come out. The stitch keeps the  cervix from opening too soon. °· Staying at the hospital. °· Taking or getting medicines, such as: °? Hormone medicines. °? Medicines to stop contractions. °? Medicines to help the baby’s lungs develop. °? Medicines to prevent your baby from having cerebral palsy. ° °What should I do if I am in preterm labor? °If you think you are going into labor too soon, call your doctor right away. °How can I prevent preterm labor? °· Do not use any tobacco products. °? Examples of these are cigarettes, chewing tobacco, and e-cigarettes. °? If you need help quitting, ask your doctor. °· Do not use street drugs. °· Do not use any medicines unless you ask your doctor if they are safe for you. °· Talk with your doctor before taking any herbal supplements. °· Make sure you gain enough weight. °· Watch for infection. If you think you might have an infection, get it checked right away. °· If you have gone into preterm labor before, tell your doctor. °This information is not intended to replace advice given to you by your health care provider. Make sure you discuss any questions you have with your health care provider. °Document Released: 03/21/2008 Document Revised: 06/05/2015 Document Reviewed: 05/16/2015 °Elsevier Interactive Patient Education © 2018 [employer] ° ° °Fetal Movement Counts °Patient Name: ________________________________________________ Patient Due Date: ____________________ °What is a fetal movement count? °A fetal movement count is the number of times that you feel your baby move during a certain amount of time. This may also be called   a fetal kick count. A fetal movement count is recommended for every pregnant woman. You may be asked to start counting fetal movements as early as week 28 of your pregnancy. °Pay attention to when your baby is most active. You may notice your baby's sleep and wake cycles. You may also notice things that make your baby move more. You should do a fetal movement count: °· When your  baby is normally most active. °· At the same time each day. ° °A good time to count movements is while you are resting, after having something to eat and drink. °How do I count fetal movements? °1. Find a quiet, comfortable area. Sit, or lie down on your side. °2. Write down the date, the start time and stop time, and the number of movements that you felt between those two times. Take this information with you to your health care visits. °3. For 2 hours, count kicks, flutters, swishes, rolls, and jabs. You should feel at least 10 movements during 2 hours. °4. You may stop counting after you have felt 10 movements. °5. If you do not feel 10 movements in 2 hours, have something to eat and drink. Then, keep resting and counting for 1 hour. If you feel at least 4 movements during that hour, you may stop counting. °Contact a health care provider if: °· You feel fewer than 4 movements in 2 hours. °· Your baby is not moving like he or she usually does. °Date: ____________ Start time: ____________ Stop time: ____________ Movements: ____________ °Date: ____________ Start time: ____________ Stop time: ____________ Movements: ____________ °Date: ____________ Start time: ____________ Stop time: ____________ Movements: ____________ °Date: ____________ Start time: ____________ Stop time: ____________ Movements: ____________ °Date: ____________ Start time: ____________ Stop time: ____________ Movements: ____________ °Date: ____________ Start time: ____________ Stop time: ____________ Movements: ____________ °Date: ____________ Start time: ____________ Stop time: ____________ Movements: ____________ °Date: ____________ Start time: ____________ Stop time: ____________ Movements: ____________ °Date: ____________ Start time: ____________ Stop time: ____________ Movements: ____________ °This information is not intended to replace advice given to you by your health care provider. Make sure you discuss any questions you have with your  health care provider. °Document Released: 01/22/2006 Document Revised: 08/22/2015 Document Reviewed: 02/01/2015 °Elsevier Interactive Patient Education © 2018 Elsevier Inc[employer] ° °

## 2017-04-24 LAB — URINE CULTURE

## 2017-05-09 NOTE — Discharge Summary (Signed)
Carrie Frazier is a 22 y.o. female. She is at [redacted]w[redacted]d gestation. Patient's last menstrual period was 08/25/2016 (lmp unknown). Estimated Date of Delivery: 06/19/17 by 23wk ultrasound  Prenatal care site: [facility]   Chief complaint: 10/10 pelvic pain  Location: pelvis Onset/timing: yesterday, "all the time"  Duration: 24 hours Quality: sharp and sore Severity: "10/10" Aggravating or alleviating conditions: nothing has made better or worse Associated signs/symptoms: +consitpation, has been seen for and treated for UTI, also BV but has not taken her prescribed medications Context: upon reviewing the pain scale, her pain is really a 5/10. she is hungry, asking for food, and animatedly talking continuously on her cell phone.  She has refused most interventions this pregnancy per her prenatal notes.   S: Resting comfortably. no CTX, no VB.no LOF,  Active fetal movement.   Maternal Medical History:   Past Medical History:  Diagnosis Date  . Anemia affecting pregnancy   . Depression   . 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 History of chlamydia     Past Surgical History:  Procedure Laterality Date  . NO PAST SURGERIES      No Known Allergies  Prior to Admission medications   Medication Sig Start Date End Date Taking? Authorizing Provider  Prenatal Vit-Fe Fumarate-FA (MULTIVITAMIN-PRENATAL) 27-0.8 MG TABS tablet Take 1 tablet by mouth daily at 12 noon.   Yes [provider]  acetaminophen (TYLENOL) 500 MG tablet Take 2 tablets (1,000 mg total) by mouth every 6 (six) hours as needed for moderate pain. 04/22/17   Ward, Elenora Fender, MD     Social History: She  reports that she has quit smoking. She has never used smokeless tobacco. She reports that she drank alcohol. She reports that she has current or past drug history. Drug: Marijuana.  Family History: no history of gyn cancers  Review of Systems: A full review of systems was performed and negative except as noted  in the HPI.     O:  BP (!) 109/56 (BP Location: Left Arm)   Pulse 95   Temp 98.3 F (36.8 C) (Oral)   Resp 16   Ht 5' 3.5" (1.613 m)   Wt 74.8 kg (165 lb)   LMP 08/25/2016 (LMP Unknown)   BMI 28.77 kg/m  No results found for this or any previous visit (from the past 48 hour(s)).   Constitutional: NAD, AAOx3  HE/ENT: extraocular movements grossly intact, moist mucous membranes CV: RRR PULM: nl respiratory effort, CTABL     Abd: gravid, non-tender, non-distended, soft      Ext: Non-tender, Nonedmeatous   Psych: mood appropriate, speech normal Pelvic deferred  NST:  Baseline: 135 Variability: moderate Accelerations present x >2 Decelerations absent Time 20 mins  Toco:  Irritable, rare ctx.  A/P: 22 y.o. [redacted]w[redacted]d here for pelvic pain  Labor: not present.   Fetal Wellbeing: Reassuring Cat 1 tracing.  Reactive NST   UA not indicative of infection, will await culture results.  D/c home stable, precautions reviewed, follow-up as scheduled.   ----- Ranae Plumber, MD Attending Obstetrician and Gynecologist [facility], Department of OB/GYN [facility]

## 2017-05-21 ENCOUNTER — Observation Stay
Admission: EM | Admit: 2017-05-21 | Discharge: 2017-05-21 | Disposition: A | Discharge status: Home / Self Care | Payer: 59 | Attending: Certified Nurse Midwife | Admitting: Certified Nurse Midwife

## 2017-05-21 DIAGNOSIS — O99113 Other diseases of the blood and blood-forming organs and certain disorders involving the immune mechanism complicating pregnancy, third trimester: Secondary | ICD-10-CM | POA: Insufficient documentation

## 2017-05-21 DIAGNOSIS — Z87891 Personal history of nicotine dependence: Secondary | ICD-10-CM | POA: Insufficient documentation

## 2017-05-21 DIAGNOSIS — Z3A35 35 weeks gestation of pregnancy: Secondary | ICD-10-CM | POA: Insufficient documentation

## 2017-05-21 DIAGNOSIS — D582 Other hemoglobinopathies: Secondary | ICD-10-CM | POA: Insufficient documentation

## 2017-05-21 DIAGNOSIS — O4703 False labor before 37 completed weeks of gestation, third trimester: Secondary | ICD-10-CM | POA: Diagnosis not present

## 2017-05-21 DIAGNOSIS — O0993 Supervision of high risk pregnancy, unspecified, third trimester: Secondary | ICD-10-CM | POA: Insufficient documentation

## 2017-05-21 DIAGNOSIS — O479 False labor, unspecified: Secondary | ICD-10-CM | POA: Diagnosis present

## 2017-05-21 LAB — WET PREP, GENITAL
CLUE CELLS WET PREP: NONE SEEN
Sperm: NONE SEEN
Trich, Wet Prep: NONE SEEN
Yeast Wet Prep HPF POC: NONE SEEN

## 2017-05-21 LAB — CHLAMYDIA/NGC RT PCR (ARMC ONLY)
CHLAMYDIA TR: NOT DETECTED
N GONORRHOEAE: NOT DETECTED

## 2017-05-21 NOTE — Discharge Instructions (Signed)
Instructions for Patients & Families  Labor   What is Labor?   Labor is when you have contractions and your cervix opening to the baby) opens up or dilates.   Labor pains or contractions feel like tightening or pain of your uterus or belly.   True labor pains will get stronger and closer together.   When should I come to the hospital?   When the labor pains are every 5 minutes or closer for 1-2 hour it is time to come to the hospital.  Count contractions from when you first feel a contraction to the start of the next contraction.   If your baby is feet or butt down (breech) or you have had a C-Section before, come to the hospital when contractions are 10 minutes apart or less for 1 hour.   When your bag of water breaks (even if you are not having contractions)   How do I know if my water bag is broken?   Most women have a gush of fluid. If you are leaking water from the vagina all the time it can also mean the water bag is broken.   If you think your bag of water is broken, but you are not sure, you need to come to the hospital or to the office. Simple tests can be done by your provider to determine if the bag of water is broken.    If you have any further questions, please contact your provider (Doctor, nurse practitioner or nurse midwife).   Call your provider or come back to the hospital if you have any of the following:   4 or more contractions in one hour before 37 weeks of pregnancy   Leakage of fluid or think your water breaks   Bright red bleeding from your vagina   Fever greater than 100.4   Decrease in your babys normal movements   If you feel a decrease in your babys normal movements and are greater than 28 weeks you should do kick counts. You should count your babys movements at least once a day for 2 hours while lying down on your side. You should feel at least 10 movements in this time period.

## 2017-05-21 NOTE — OB Triage Note (Signed)
Carrie Frazier here with c/o back pain, intermittent ctx at night. Denies LOF, bleeding, reports positive fetal movement

## 2017-05-21 NOTE — Discharge Summary (Signed)
Carrie Frazier is a 22 y.o. female. She is at [redacted]w[redacted]d gestation,dated by [redacted]w[redacted]d ultrasound. Patient's last menstrual period was 08/25/2016 (lmp unknown). Estimated Date of Delivery: 06/19/17  Prenatal care site: [facility]   S:  Carrie Frazier had an appointment this afternoon, but when she arrived at the office the provider she was supposed to see was on L&D attending a delivery. She reported increased vaginal discharge and frequent contractions to the front desk staff, who instructed her to report to L&D for triage.   She reports increased vaginal discharge over the past two weeks that is tan to clear and gel-like. She reports no vaginal odor, no vulvar or vaginal itching or irritation, and no leakage of fluid. She reports no vaginal bleeding and active fetal movement. She also reports what sound like Braxton-Hicks contractions nightly, every 20-30 minutes. She is also having pelvic pressure and discomfort. She is currently resting comfortably in bed.    Maternal Medical History:   Past Medical History:  Diagnosis Date  . Anemia affecting pregnancy   . Depression   . 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 History of chlamydia     Past Surgical History:  Procedure Laterality Date  . NO PAST SURGERIES      No Known Allergies  Prior to Admission medications   Medication Sig Start Date End Date Taking? Authorizing Provider  acetaminophen (TYLENOL) 500 MG tablet Take 2 tablets (1,000 mg total) by mouth every 6 (six) hours as needed for moderate pain. 04/22/17   Ward, Elenora Fender, MD  Prenatal Vit-Fe Fumarate-FA (MULTIVITAMIN-PRENATAL) 27-0.8 MG TABS tablet Take 1 tablet by mouth daily at 12 noon.    [provider]     Social History: She  reports that she has quit smoking. She has never used smokeless tobacco. She reports that she drank alcohol. She reports that she has current or past drug history. Drug: Marijuana.  Family History: no known family history of hypertension or  diabetes.  Review of Systems: A full review of systems was performed and negative except as noted in the HPI.    O:  BP 118/69 (BP Location: Left Arm)   Pulse 95   Temp 98.3 F (36.8 C) (Oral)   Resp 16   LMP 08/25/2016 (LMP Unknown)  No results found for this or any previous visit (from the past 48 hour(s)).   Constitutional: NAD, AAOx3  HE/ENT: extraocular movements grossly intact, moist mucous membranes CV: RRR PULM: nl respiratory effort, CTABL     Abd: gravid, non-tender, non-distended, soft      Ext: Non-tender, Nonedmeatous   Psych: mood appropriate, speech normal Pelvic: 1cm/20%/-4 medium/mid-position           : no pooling   NST:  Baseline: 150bpm Variability: moderate Accelerations: present Decelerations: absent Toco: uterine irritability Time:  Wet prep: many WBCs, no yeast, no trich, no clue cells, no sperm   A/P: 22 y.o. [redacted]w[redacted]d here for antenatal surveillance for contractions and increased vaginal discharge.  Labor: not present.   Fetal Wellbeing: Reactive NST, reassuring for gestational age.  Encouraged hydration, Tylenol PRN, and pregnancy support band for Braxton-Hicks contractions and pelvic discomfort. Advised to stop breastfeeding if she notices increased contractions with feeding.  Wet prep negative for BV or yeast. Reviewed normal versus abnormal vaginal discharge.   GBS and GC/CT collected today.  D/c home stable, precautions reviewed, follow-up in the office weekly.    Carrie Frazier 05/21/2017 6:43 PM ----- Carrie Frazier, CNM Certified  Nurse Midwife [facility], Department of OB/GYN [facility]

## 2017-05-24 LAB — CULTURE, BETA STREP (GROUP B ONLY)

## 2017-06-16 ENCOUNTER — Inpatient Hospital Stay
Admission: EM | Admit: 2017-06-16 | Discharge: 2017-06-18 | DRG: 806 | Disposition: A | Discharge status: Home / Self Care | Payer: 59 | Attending: Certified Nurse Midwife | Admitting: Certified Nurse Midwife

## 2017-06-16 ENCOUNTER — Inpatient Hospital Stay: Payer: 59 | Admitting: Anesthesiology

## 2017-06-16 ENCOUNTER — Other Ambulatory Visit: Payer: Self-pay

## 2017-06-16 DIAGNOSIS — O9081 Anemia of the puerperium: Secondary | ICD-10-CM | POA: Diagnosis not present

## 2017-06-16 DIAGNOSIS — Z87891 Personal history of nicotine dependence: Secondary | ICD-10-CM

## 2017-06-16 DIAGNOSIS — O43123 Velamentous insertion of umbilical cord, third trimester: Secondary | ICD-10-CM | POA: Diagnosis present

## 2017-06-16 DIAGNOSIS — D62 Acute posthemorrhagic anemia: Secondary | ICD-10-CM | POA: Diagnosis not present

## 2017-06-16 DIAGNOSIS — O479 False labor, unspecified: Secondary | ICD-10-CM | POA: Diagnosis present

## 2017-06-16 DIAGNOSIS — Z3A39 39 weeks gestation of pregnancy: Secondary | ICD-10-CM

## 2017-06-16 DIAGNOSIS — Z3483 Encounter for supervision of other normal pregnancy, third trimester: Secondary | ICD-10-CM | POA: Diagnosis present

## 2017-06-16 LAB — CBC
HEMATOCRIT: 35 % (ref 35.0–47.0)
HEMOGLOBIN: 12 g/dL (ref 12.0–16.0)
MCH: 25.2 pg — ABNORMAL LOW (ref 26.0–34.0)
MCHC: 34.2 g/dL (ref 32.0–36.0)
MCV: 73.8 fL — ABNORMAL LOW (ref 80.0–100.0)
Platelets: 208 10*3/uL (ref 150–440)
RBC: 4.74 MIL/uL (ref 3.80–5.20)
RDW: 16.8 % — AB (ref 11.5–14.5)
WBC: 12.4 10*3/uL — AB (ref 3.6–11.0)

## 2017-06-16 LAB — TYPE AND SCREEN
ABO/RH(D): B POS
Antibody Screen: NEGATIVE

## 2017-06-16 MED ORDER — EPHEDRINE 5 MG/ML INJ: 10.0000 mg | INTRAVENOUS | Status: DC | PRN

## 2017-06-16 MED ORDER — LACTATED RINGERS IV SOLN
INTRAVENOUS | Status: DC
  Administered 2017-06-16 (×2): via INTRAVENOUS

## 2017-06-16 MED ORDER — LACTATED RINGERS IV SOLN
500.0000 mL | Freq: Once | INTRAVENOUS | Status: AC
  Administered 2017-06-16: 500 mL via INTRAVENOUS

## 2017-06-16 MED ORDER — DIPHENHYDRAMINE HCL 50 MG/ML IJ SOLN: 12.5000 mg | INTRAMUSCULAR | Status: DC | PRN

## 2017-06-16 MED ORDER — FENTANYL 2.5 MCG/ML W/ROPIVACAINE 0.15% IN NS 100 ML EPIDURAL (ARMC): 12.0000 mL/h | EPIDURAL | Status: DC

## 2017-06-16 MED ORDER — LIDOCAINE HCL (PF) 2 % IJ SOLN: INTRAMUSCULAR | Status: DC | PRN

## 2017-06-16 MED ORDER — OXYTOCIN BOLUS FROM INFUSION
500.0000 mL | Freq: Once | INTRAVENOUS | Status: DC
Start: 2017-06-16 — End: 2017-06-17

## 2017-06-16 MED ORDER — LIDOCAINE HCL (PF) 2 % IJ SOLN
INTRAMUSCULAR | Status: DC | PRN
  Administered 2017-06-16: 8 mL via INTRADERMAL

## 2017-06-16 MED ORDER — PHENYLEPHRINE 40 MCG/ML (10ML) SYRINGE FOR IV PUSH (FOR BLOOD PRESSURE SUPPORT): 80.0000 ug | PREFILLED_SYRINGE | INTRAVENOUS | Status: DC | PRN

## 2017-06-16 MED ORDER — OXYTOCIN 40 UNITS IN LACTATED RINGERS INFUSION - SIMPLE MED
2.5000 [IU]/h | INTRAVENOUS | Status: DC
  Administered 2017-06-17 (×2): 2.5 [IU]/h via INTRAVENOUS
  Filled 2017-06-16: qty 1000

## 2017-06-16 MED ORDER — MISOPROSTOL 200 MCG PO TABS
ORAL_TABLET | ORAL | Status: AC
  Filled 2017-06-16: qty 4

## 2017-06-16 MED ORDER — OXYTOCIN 40 UNITS IN LACTATED RINGERS INFUSION - SIMPLE MED
INTRAVENOUS | Status: AC
  Filled 2017-06-16: qty 1000

## 2017-06-16 MED ORDER — AMMONIA AROMATIC IN INHA
RESPIRATORY_TRACT | Status: AC
  Filled 2017-06-16: qty 10

## 2017-06-16 MED ORDER — ONDANSETRON HCL 4 MG/2ML IJ SOLN: 4.0000 mg | Freq: Four times a day (QID) | INTRAMUSCULAR | Status: DC | PRN

## 2017-06-16 MED ORDER — BUPIVACAINE HCL (PF) 0.25 % IJ SOLN
INTRAMUSCULAR | Status: DC | PRN
  Administered 2017-06-16: 10 mL via EPIDURAL

## 2017-06-16 MED ORDER — OXYTOCIN 10 UNIT/ML IJ SOLN
INTRAMUSCULAR | Status: AC
  Filled 2017-06-16: qty 2

## 2017-06-16 MED ORDER — LACTATED RINGERS IV SOLN
500.0000 mL | INTRAVENOUS | Status: DC | PRN
  Administered 2017-06-16: 200 mL via INTRAVENOUS

## 2017-06-16 MED ORDER — LIDOCAINE HCL (PF) 1 % IJ SOLN: 30.0000 mL | INTRAMUSCULAR | Status: DC | PRN

## 2017-06-16 MED ORDER — LIDOCAINE HCL (PF) 1 % IJ SOLN
INTRAMUSCULAR | Status: AC
  Filled 2017-06-16: qty 30

## 2017-06-16 MED ORDER — BUTORPHANOL TARTRATE 1 MG/ML IJ SOLN
1.0000 mg | INTRAMUSCULAR | Status: DC | PRN
  Administered 2017-06-16: 1 mg via INTRAVENOUS
  Filled 2017-06-16: qty 1

## 2017-06-16 MED ORDER — FENTANYL 2.5 MCG/ML W/ROPIVACAINE 0.15% IN NS 100 ML EPIDURAL (ARMC)
EPIDURAL | Status: DC | PRN
  Administered 2017-06-16: 12 mL/h via EPIDURAL

## 2017-06-16 MED ORDER — ACETAMINOPHEN 325 MG PO TABS: 650.0000 mg | ORAL_TABLET | ORAL | Status: DC | PRN

## 2017-06-16 MED ORDER — LIDOCAINE-EPINEPHRINE (PF) 1.5 %-1:200000 IJ SOLN
INTRAMUSCULAR | Status: DC | PRN
  Administered 2017-06-16: 3 mL via PERINEURAL

## 2017-06-16 MED ORDER — LIDOCAINE HCL (PF) 1 % IJ SOLN
INTRAMUSCULAR | Status: DC | PRN
  Administered 2017-06-16: 3 mL

## 2017-06-16 MED ORDER — SOD CITRATE-CITRIC ACID 500-334 MG/5ML PO SOLN: 30.0000 mL | ORAL | Status: DC | PRN

## 2017-06-16 MED ORDER — FENTANYL 2.5 MCG/ML W/ROPIVACAINE 0.15% IN NS 100 ML EPIDURAL (ARMC)
EPIDURAL | Status: AC
  Filled 2017-06-16: qty 100

## 2017-06-16 NOTE — H&P (Addendum)
OB History & Physical   History of Present Illness:  Chief Complaint:   HPI:  Carrie Frazier is a 22 y.o. G46P1001 female at [redacted]w[redacted]d dated by [redacted]w[redacted]d ultrasound.  She presents to L&D in early labor.   She reports:  -active fetal movement -no leakage of fluid  -bloody show -onset of contractions at 1900 on 06/15/17 currently every 5-6 minutes  Pregnancy Issues: 1. Entered prenatal care at 23 weeks 2. Short interpregnancy interval, s/p SVD 02/01/2016 3. Anemia on iron supplement 4. Pubic symphysis separation   Maternal Medical History:   Past Medical History:  Diagnosis Date  . Anemia affecting pregnancy   . Depression   . 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 History of chlamydia     Past Surgical History:  Procedure Laterality Date  . NO PAST SURGERIES      No Known Allergies  Prior to Admission medications   Medication Sig Start Date End Date Taking? Authorizing Provider  Prenatal Vit-Fe Fumarate-FA (MULTIVITAMIN-PRENATAL) 27-0.8 MG TABS tablet Take 1 tablet by mouth daily at 12 noon.   Yes [provider]  acetaminophen (TYLENOL) 500 MG tablet Take 2 tablets (1,000 mg total) by mouth every 6 (six) hours as needed for moderate pain. Patient not taking: Reported on 06/16/2017 04/22/17   Ward, Elenora Fender, MD     Prenatal care site: [facility]  Social History: She  reports that she has quit smoking. She has never used smokeless tobacco. She reports that she drank alcohol. She reports that she has current or past drug history. Drug: Marijuana.  Family History: No family history of hypertensive disorders.   Review of Systems: A full review of systems was performed and negative except as noted in the HPI.    Physical Exam:  Vital Signs: BP 119/71 (BP Location: Left Arm)   Pulse (!) 118   Temp 97.7 F (36.5 C) (Oral)   Resp 18   Ht 5\' 3"  (1.6 m)   Wt 84.8 kg (187 lb)   LMP 08/25/2016 (LMP Unknown)   BMI 33.13 kg/m   General:   alert, cooperative,  appears stated age and moderate distress  Skin:  normal and no rash or abnormalities  Neurologic:    Alert & oriented x 3  Lungs:   clear to auscultation bilaterally  Heart:   regular rate and rhythm, S1, S2 normal, no murmur, click, rub or gallop  Abdomen:  soft, non-tender; bowel sounds normal; no masses,  no organomegaly  FHT:  130 BPM  Presentations: cephalic  Cervix:    Dilation: 5cm   Effacement: 80%   Station:  -2   Consistency: soft   Position: anterior  Extremities: : non-tender, symmetric, no edema bilaterally.    EFW: 7lb8oz  No results found for this or any previous visit (from the past 24 hour(s)).  Pertinent Results:  Prenatal Labs: Blood type/Rh B+  Antibody screen neg  Rubella Immune  Varicella Immune  RPR NR  HBsAg Neg  HIV NR  GC neg  Chlamydia neg  1 hour GTT 100  3 hour GTT  n/a  GBS negative   FHT: FHR: 135 bpm, variability: moderate,  accelerations:  Present,  decelerations:  Absent Category/reactivity:  Category I TOCO: irregular, every 6-8 minutes, patient reports feeling more often   Assessment:  Carrie Frazier is a 22 y.o. G48P1001 female at [redacted]w[redacted]d with labor.   Plan:  1. Admit to Labor & Delivery; consents reviewed and obtained  2. Fetal  Well being  - Fetal Tracing: category I - GBS negative - Presentation: cephalic confirmed by Leopold's   3. Routine OB: - Prenatal labs reviewed, as above - Rh positive - CBC & T&S on admit - Clear fluids, IVF  4. Monitoring of Labor -  Contractions monitored with external toco in place -  Pelvis proven to 6lb4oz -  Plan for continuous fetal monitoring  -  Maternal pain control as desired: IVPM, nitrous, regional anesthesia -  Expectant management -  Anticipate vaginal delivery  5. Post Partum Planning: - Infant feeding: formula - Contraception: considering IUD  Carrie Frazier, CNM 06/16/2017 3:40 PM ----- Carrie DelMargaret Lisl Slingerland Certified Nurse Midwife [facility], Department  of OB/GYN [facility]

## 2017-06-16 NOTE — Progress Notes (Signed)
Labor Progress Note  Carrie Frazier Carrie Frazier is a 22 y.o. G2P1001 at 1012w4d by 3397w3d ultrasound admitted for active labor.  Subjective:  Patient standing with partner for support. Reporting hip discomfort and a lot of pain with contractions. Patient states that she needs something for pain relief.   Objective: BP 123/69 (BP Location: Left Arm)   Pulse (!) 120   Temp 98.1 F (36.7 C) (Oral)   Resp 20   Ht 5\' 3"  (1.6 m)   Wt 84.8 kg (187 lb)   LMP 08/25/2016 (LMP Unknown)   BMI 33.13 kg/m   Fetal Assessment: FHT:  FHR: 140 bpm, variability: moderate,  accelerations:  Present,  decelerations:  Present variable, late Category/reactivity:  Category II UC:   regular, every 2-3 minutes SVE:    Dilation: 6.5cm  Effacement: 100%  Station:  -1  Consistency: soft  Position: anterior  Membrane status: AROM 1805 06/16/2017 Amniotic color: light meconium  Labs: Lab Results  Component Value Date   WBC 12.4 (H) 06/16/2017   HGB 12.0 06/16/2017   HCT 35.0 06/16/2017   MCV 73.8 (L) 06/16/2017   PLT 208 06/16/2017    Assessment / Plan: Spontaneous labor, progressing normally  Labor: Progressing normally Fetal Wellbeing:  Category II for occasional variable decels, non-recurrent lates which improved with IVF bolus, overall reassuring Pain Control:  Requesting epidural Anticipated MOD:  NSVD  Carrie Frazier, CNM 06/16/2017, 9:28 PM

## 2017-06-16 NOTE — Anesthesia Preprocedure Evaluation (Signed)
Anesthesia Evaluation  Patient identified by MRN, date of birth, ID band Patient awake    Reviewed: Allergy & Precautions, H&P , NPO status , Patient's Chart, lab work & pertinent test results  History of Anesthesia Complications Negative for: history of anesthetic complications  Airway Mallampati: II  TM Distance: >3 FB Neck ROM: full    Dental no notable dental hx.    Pulmonary neg pulmonary ROS, former smoker,    Pulmonary exam normal        Cardiovascular negative cardio ROS Normal cardiovascular exam     Neuro/Psych PSYCHIATRIC DISORDERS Depressionnegative neurological ROS     GI/Hepatic Neg liver ROS,   Endo/Other  negative endocrine ROS  Renal/GU negative Renal ROS  negative genitourinary   Musculoskeletal   Abdominal   Peds  Hematology  (+) anemia ,   Anesthesia Other Findings   Reproductive/Obstetrics (+) Pregnancy                             Anesthesia Physical  Anesthesia Plan  ASA: II  Anesthesia Plan: Epidural   Post-op Pain Management:    Induction:   PONV Risk Score and Plan:   Airway Management Planned:   Additional Equipment:   Intra-op Plan:   Post-operative Plan:   Informed Consent: I have reviewed the patients History and Physical, chart, labs and discussed the procedure including the risks, benefits and alternatives for the proposed anesthesia with the patient or authorized representative who has indicated his/her understanding and acceptance.     Plan Discussed with: Anesthesiologist  Anesthesia Plan Comments:         Anesthesia Quick Evaluation

## 2017-06-16 NOTE — Anesthesia Procedure Notes (Signed)
Epidural Patient location during procedure: OB Start time: 06/16/2017 9:56 PM End time: 06/16/2017 10:05 PM  Staffing Anesthesiologist: Yves Dillarroll, Loman Logan, MD Performed: anesthesiologist   Preanesthetic Checklist Completed: patient identified, site marked, surgical consent, pre-op evaluation, timeout performed, IV checked, risks and benefits discussed and monitors and equipment checked  Epidural Patient position: sitting Prep: Betadine Patient monitoring: heart rate, continuous pulse ox and blood pressure Approach: midline Location: L4-L5 Injection technique: LOR air  Needle:  Needle type: Tuohy  Needle gauge: 17 G Needle length: 9 cm and 9 Catheter type: closed end flexible Catheter size: 19 Gauge Test dose: negative and 1.5% lidocaine with Epi 1:200 K  Assessment Events: blood not aspirated, injection not painful, no injection resistance, negative IV test and no paresthesia  Additional Notes Time out called.  Patient placed in sitting position.  Back prepped and draped in sterile fashion.  A skin wheal was made with 1% Lidocaine plain in the L4-L5 interspace.  A 17G Tuohy needle was advanced into the epidural space by a loss of resistance technique.  The epidural catheter was threaded  3 cm and the TD was negative.  The catheter was attached to the back in sterile fashion.  No blood or paresthesias.  The patient tolerated the procedure well.Reason for block:procedure for pain

## 2017-06-16 NOTE — OB Triage Note (Signed)
G2P1 5125w4d presents to [facility] d/t ctx "every six minutes apart" that began yesterday evening "around 7pm" and "bloody show." Reports positive fetal movement. Monitors applied and assessing

## 2017-06-16 NOTE — Progress Notes (Signed)
Labor Progress Note  Carrie Frazier is a 22 y.o. G2P1001 at 8457w4d by 6957w3d ultrasound admitted for active labor.  Subjective:  Patient resting on her left side in bed. Breathing well through contractions.   Objective: BP 127/78 (BP Location: Right Arm)   Pulse 93   Temp 98.7 F (37.1 C) (Oral)   Resp 18   Ht 5\' 3"  (1.6 m)   Wt 84.8 kg (187 lb)   LMP 08/25/2016 (LMP Unknown)   BMI 33.13 kg/m   Fetal Assessment: FHT:  FHR: 135 bpm, variability: moderate,  accelerations:  Present,  decelerations:  Present early, variable Category/reactivity:  Category II UC:   regular, every 2-4 minutes SVE:    Dilation: 6cm  Effacement: 90%  Station:  -2  Consistency: soft  Position: middle  Membrane status: AROM 1805 06/16/2017 Amniotic color: light meconium  Labs: Lab Results  Component Value Date   WBC 12.4 (H) 06/16/2017   HGB 12.0 06/16/2017   HCT 35.0 06/16/2017   MCV 73.8 (L) 06/16/2017   PLT 208 06/16/2017    Assessment / Plan: Spontaneous labor, progressing normally  Labor: Progressing normally Fetal Wellbeing:  Category II for occasional variable decels, overall reassuring Pain Control:  Labor support without medications; IVPM, nitrous oxide, and epidural available if patient desires Anticipated MOD:  NSVD  Genia DelMargaret Magaret Justo, CNM 06/16/2017, 6:12 PM

## 2017-06-16 NOTE — Progress Notes (Signed)
Labor Progress Note  Carrie Frazier Carrie Frazier is a 22 y.o. G2P1001 at 8419w4d by 4426w3d ultrasound admitted for active labor.  Subjective:  Patient resting in bed with peanut ball in place. Reports good relief s/p epidural placement.   Objective: BP 128/66   Pulse (!) 125   Temp 98.3 F (36.8 C) (Oral)   Resp 18   Ht 5\' 3"  (1.6 m)   Wt 84.8 kg (187 lb)   LMP 08/25/2016 (LMP Unknown)   SpO2 99%   BMI 33.13 kg/m   Fetal Assessment: FHT:  FHR: 135 bpm, variability: minimal to moderate,  accelerations:  Absent,  decelerations:  Present variable Category/reactivity:  Category II UC:   regular, every 2-3 minutes SVE:    Dilation: 10cm  Effacement: 100%  Station:  0  Consistency: soft  Position: anterior  Membrane status: AROM 1805 06/16/2017 Amniotic color: light meconium  Labs: Lab Results  Component Value Date   WBC 12.4 (H) 06/16/2017   HGB 12.0 06/16/2017   HCT 35.0 06/16/2017   MCV 73.8 (L) 06/16/2017   PLT 208 06/16/2017    Assessment / Plan: Spontaneous labor, progressing normally  Labor: Progressing normally, plan to start pushing Fetal Wellbeing:  Category II for occasional variable decels and period of minimal variability after Stadol administration, overall reassuring Pain Control:  Epidural in place with good relief Anticipated MOD:  NSVD  Carrie Frazier, CNM 06/16/2017, 11:46 PM

## 2017-06-16 NOTE — Progress Notes (Signed)
Spoke with Genia DelMargaret Haviland, CNM. Plan to remove US/toco and encourage pt to ambulate. CNM will reassess pt SVE at 1515.

## 2017-06-17 LAB — CBC
HEMATOCRIT: 29.2 % — AB (ref 35.0–47.0)
Hemoglobin: 10.1 g/dL — ABNORMAL LOW (ref 12.0–16.0)
MCH: 25.4 pg — ABNORMAL LOW (ref 26.0–34.0)
MCHC: 34.5 g/dL (ref 32.0–36.0)
MCV: 73.7 fL — AB (ref 80.0–100.0)
Platelets: 194 10*3/uL (ref 150–440)
RBC: 3.96 MIL/uL (ref 3.80–5.20)
RDW: 16.5 % — ABNORMAL HIGH (ref 11.5–14.5)
WBC: 16.1 10*3/uL — ABNORMAL HIGH (ref 3.6–11.0)

## 2017-06-17 LAB — RPR: RPR: NONREACTIVE

## 2017-06-17 MED ORDER — ONDANSETRON HCL 4 MG/2ML IJ SOLN: 4.0000 mg | INTRAMUSCULAR | Status: DC | PRN

## 2017-06-17 MED ORDER — ACETAMINOPHEN 325 MG PO TABS: 650.0000 mg | ORAL_TABLET | ORAL | Status: DC | PRN

## 2017-06-17 MED ORDER — WITCH HAZEL-GLYCERIN EX PADS: 1.0000 "application " | MEDICATED_PAD | CUTANEOUS | Status: DC | PRN

## 2017-06-17 MED ORDER — COCONUT OIL OIL
1.0000 "application " | TOPICAL_OIL | Status: DC | PRN
  Filled 2017-06-17: qty 120

## 2017-06-17 MED ORDER — TETANUS-DIPHTH-ACELL PERTUSSIS 5-2.5-18.5 LF-MCG/0.5 IM SUSP: 0.5000 mL | Freq: Once | INTRAMUSCULAR | Status: DC

## 2017-06-17 MED ORDER — CEFAZOLIN SODIUM-DEXTROSE 1-4 GM/50ML-% IV SOLN
1.0000 g | Freq: Once | INTRAVENOUS | Status: AC
  Administered 2017-06-17: 1 g via INTRAVENOUS
  Filled 2017-06-17: qty 50

## 2017-06-17 MED ORDER — PRENATAL MULTIVITAMIN CH
1.0000 | ORAL_TABLET | Freq: Every day | ORAL | Status: DC
  Administered 2017-06-17 – 2017-06-18 (×2): 1 via ORAL
  Filled 2017-06-17 (×2): qty 1

## 2017-06-17 MED ORDER — IBUPROFEN 600 MG PO TABS
600.0000 mg | ORAL_TABLET | Freq: Four times a day (QID) | ORAL | Status: DC
  Administered 2017-06-17: 600 mg via ORAL
  Filled 2017-06-17: qty 1

## 2017-06-17 MED ORDER — SENNOSIDES-DOCUSATE SODIUM 8.6-50 MG PO TABS
2.0000 | ORAL_TABLET | ORAL | Status: DC
  Administered 2017-06-18: 2 via ORAL
  Filled 2017-06-17: qty 2

## 2017-06-17 MED ORDER — DIPHENHYDRAMINE HCL 25 MG PO CAPS: 25.0000 mg | ORAL_CAPSULE | Freq: Four times a day (QID) | ORAL | Status: DC | PRN

## 2017-06-17 MED ORDER — SIMETHICONE 80 MG PO CHEW: 160.0000 mg | CHEWABLE_TABLET | ORAL | Status: DC | PRN

## 2017-06-17 MED ORDER — DIBUCAINE 1 % RE OINT: 1.0000 "application " | TOPICAL_OINTMENT | RECTAL | Status: DC | PRN

## 2017-06-17 MED ORDER — IBUPROFEN 600 MG PO TABS
600.0000 mg | ORAL_TABLET | Freq: Four times a day (QID) | ORAL | Status: DC
  Administered 2017-06-17 – 2017-06-18 (×4): 600 mg via ORAL
  Filled 2017-06-17 (×5): qty 1

## 2017-06-17 MED ORDER — BENZOCAINE-MENTHOL 20-0.5 % EX AERO
1.0000 "application " | INHALATION_SPRAY | CUTANEOUS | Status: DC | PRN
  Administered 2017-06-17: 1 via TOPICAL
  Filled 2017-06-17: qty 56

## 2017-06-17 MED ORDER — ONDANSETRON HCL 4 MG PO TABS: 4.0000 mg | ORAL_TABLET | ORAL | Status: DC | PRN

## 2017-06-17 NOTE — Lactation Note (Signed)
This note was copied from a baby's chart. Lactation Consultation Note  Patient Name: Boy Jermeka Janene HarveyCohn ZOXWR'UToday's Date: 06/17/2017 Reason for consult: Initial assessment   Maternal Data Has patient been taught Hand Expression?: Yes Does the patient have breastfeeding experience prior to this delivery?: Yes  Feeding Feeding Type: Breast Fed Length of feed: 15 min  LATCH Score Latch: Grasps breast easily, tongue down, lips flanged, rhythmical sucking.  Audible Swallowing: Spontaneous and intermittent  Type of Nipple: Everted at rest and after stimulation  Comfort (Breast/Nipple): Soft / non-tender  Hold (Positioning): No assistance needed to correctly position infant at breast.  LATCH Score: 10  Interventions    Lactation Tools Discussed/Used     Consult Status Consult Status: PRN  Mom is breastfeeding well. Mom is tandem nursing now 1yo as well. Mom is able to position and latch baby on correctly. LC informed mom that body is making colostrum again and to always feed the younger baby before the older one. Mom has bf support info.   Burnadette PeterJaniya M Javian Nudd 06/17/2017, 3:09 PM

## 2017-06-17 NOTE — Discharge Summary (Signed)
Obstetric Discharge Summary   Patient ID: Patient Name: Carrie Frazier DOB: 16-Nov-1995 MRN: [id number]  Date of Admission: 06/16/2017 Date of Delivery: 06/17/17 Delivered by: Genia Del, CNM Date of Discharge: 06/18/2017  Primary OB: [facility]  [id number] last menstrual period was 08/25/2016 (lmp unknown). EDC Estimated Date of Delivery: 06/19/17 Gestational Age at Delivery: [redacted]w[redacted]d   Antepartum complications:  1. Entered prenatal care at 23 weeks 2. Short interpregnancy interval, s/p SVD 02/01/2016 3. Anemia on iron supplement 4. Pubic symphysis separation  Admitting Diagnosis: Active labor  Secondary Diagnoses: Patient Active Problem List   Diagnosis Date Noted  . Labor and delivery, indication for care 06/16/2017  . Supervision of high risk pregnancy in third trimester 05/21/2017  . Uterine contractions during pregnancy 05/21/2017  . Labor and delivery indication for care or intervention 04/22/2017  . Abdominal pain 03/18/2017  . Pelvic pain in pregnancy 12/11/2016  . Indication for care in labor or delivery 01/29/2016    Augmentation: AROM Complications: cord avulsion, manual removal of placenta Intrapartum complications/course: She progressed to complete and had a spontaneous vaginal birth of a live female over an intact perineum. The fetal head was delivered in DOA position with restitution to LOA. No nuchal cord. Anterior then posterior shoulders delivered with minimal assistance. Baby placed on mom's abdomen and attended to by peds NP and transition RN. Cord clamped and cut when after 60 second delay by father of the baby. Controlled cord traction applied after gush of blood. Cord descended easily, then avulsed. Placenta noted to be in posterior vagina at cervical opening with manual exam. Manually removed with sweep of the vagina and lower uterine segment. Placenta appeared intact and cord appeared to have avulsed at site of marginal insertion on  placenta. Small amount of clot with tiny amount of trailing membrane expressed with uterine massage.  Delivery Type: spontaneous vaginal delivery Anesthesia: epidural Placenta: manually removed Laceration: none Episiotomy: none  Newborn Data: Live born female  Birth Weight: 7# 7.6oz  APGAR: 8, 9  Newborn Delivery   Birth date/time:  06/17/2017 00:46:00 Delivery type:  Vaginal, Spontaneous      Postpartum Course  Patient had an uncomplicated postpartum course.  By time of discharge on PPD#1, her pain was controlled on oral pain medications; she had appropriate lochia and was ambulating, voiding without difficulty and tolerating regular diet.  She was deemed stable for discharge to home.       Labs: CBC Latest Ref Rng & Units 06/17/2017 06/16/2017 11/06/2016  WBC 3.6 - 11.0 K/uL 16.1(H) 12.4(H) 7.6  Hemoglobin 12.0 - 16.0 g/dL 10.1(L) 12.0 12.4  Hematocrit 35.0 - 47.0 % 29.2(L) 35.0 35.6  Platelets 150 - 440 K/uL 194 208 269   B POS  Physical exam:  BP (!) 103/92   Pulse 80   Temp 97.8 F (36.6 C) (Oral)   Resp 20   Ht 5\' 3"  (1.6 m)   Wt 187 lb (84.8 kg)   LMP 08/25/2016 (LMP Unknown)   SpO2 98%   Breastfeeding? Unknown   BMI 33.13 kg/m  General: alert and no distress Pulm: normal respiratory effort Lochia: appropriate Abdomen: soft, NT Uterine Fundus: firm, below umbilicus Extremities: No evidence of DVT seen on physical exam. No lower extremity edema.   Disposition: stable, discharge to home Baby Feeding: breastmilk Baby Disposition: home with mom  Contraception: considering IUD  Prenatal Labs:  Blood type/Rh B+  Antibody screen neg  Rubella Immune  Varicella Immune  RPR NR  HBsAg Neg  HIV NR  GC neg  Chlamydia neg  1 hour GTT 100  3 hour GTT  n/a  GBS negative   Rh Immune globulin given: n/a Rubella vaccine given: n/a Tdap vaccine given in AP or PP setting: declined AP Flu vaccine given in AP or PP setting: declined AP  Plan:  Jerilynn Nichele  Ono was discharged to home in good condition. [facility] with delivery provider in 6 weeks  Discharge Instructions: Per After Visit Summary. Activity: Advance as tolerated. Pelvic rest for 6 weeks.   Diet: Regular Discharge Medications: Allergies as of 06/18/2017   No Known Allergies     Medication List    TAKE these medications   acetaminophen 500 MG tablet Commonly known as:  TYLENOL Take 2 tablets (1,000 mg total) by mouth every 6 (six) hours as needed for moderate pain.   ibuprofen 600 MG tablet Commonly known as:  ADVIL,MOTRIN Take 1 tablet (600 mg total) by mouth every 6 (six) hours.   multivitamin-prenatal 27-0.8 MG Tabs tablet Take 1 tablet by mouth daily at 12 noon.   senna-docusate 8.6-50 MG tablet Commonly known as:  Senokot-S Take 2 tablets by mouth at bedtime.      Outpatient follow up:  Follow-up Information    Genia DelHaviland, Margaret, CNM. Schedule an appointment as soon as possible for a visit in 6 week(s).   Specialty:  Certified Nurse Midwife Contact information: [address] [place] [id number] [phone]            Signed: Randa NgoMcVey, Channon Brougher A, CNM 8:48 AM 06/18/2017

## 2017-06-17 NOTE — Plan of Care (Signed)
Vs stable; to mother/baby unit at 0345; has voided post delivery (while still in L&D); breast and formula feeding

## 2017-06-18 MED ORDER — SENNOSIDES-DOCUSATE SODIUM 8.6-50 MG PO TABS: 2.0000 | ORAL_TABLET | Freq: Every day | ORAL | Status: DC

## 2017-06-18 MED ORDER — IBUPROFEN 600 MG PO TABS: 600.0000 mg | ORAL_TABLET | Freq: Four times a day (QID) | ORAL | 0 refills | Status: DC

## 2017-06-18 NOTE — Progress Notes (Signed)
Post Partum Day 1 Subjective: Doing well, no complaints.  Tolerating regular diet, pain with PO meds, voiding and ambulating without difficulty.   No CP SOB Fever,Chills, N/V or leg pain; denies nipple or breast pain; no HA change of vision, RUQ/epigastric pain  Objective: BP (!) 103/92   Pulse 80   Temp 97.8 F (36.6 C) (Oral)   Resp 20   Ht 5\' 3"  (1.6 m)   Wt 187 lb (84.8 kg)   LMP 08/25/2016 (LMP Unknown)   SpO2 98%   Breastfeeding? Unknown   BMI 33.13 kg/m  Single elevated BP noted.    Physical Exam:  General: NAD Breasts: soft/nontender CV: RRR Pulm: nl effort, CTABL Abdomen: soft, NT, BS x 4 Perineum: minimal edema, intact Lochia: small Uterine Fundus: fundus firm and 2 fb below umbilicus DVT Evaluation: no cords, ttp LEs   Recent Labs    06/16/17 1601 06/17/17 0634  HGB 12.0 10.1*  HCT 35.0 29.2*  WBC 12.4* 16.1*  PLT 208 194    Assessment/Plan: 21 y.o. G2P2002 postpartum day # 1  - Continue routine PP care - Lactation consult prn- tandom breastfeeding with NB and her 77month old;  reminded pt to feed newborn first, and then her older baby.   - Discussed contraceptive options: undecided, declines Depo at DC.  - Acute blood loss anemia - hemodynamically stable and asymptomatic; po ferrous sulfate BID with stool softeners  - Immunization status: Needs tdap prior to DC if desired.   Disposition: Does desire Dc home today.     Carrie Frazier A, CNM 06/18/2017  8:08 AM

## 2017-06-18 NOTE — Anesthesia Postprocedure Evaluation (Signed)
Anesthesia Post Note  Patient: Carrie Frazier  Procedure(s) Performed: AN AD HOC LABOR EPIDURAL  Patient location during evaluation: Mother Baby Anesthesia Type: Epidural Level of consciousness: awake and alert and oriented Pain management: satisfactory to patient Vital Signs Assessment: post-procedure vital signs reviewed and stable Respiratory status: respiratory function stable Cardiovascular status: stable Postop Assessment: epidural receding, no headache, no backache, patient able to bend at knees, no apparent nausea or vomiting, able to ambulate and adequate PO intake Anesthetic complications: no     Last Vitals:  Vitals:   06/18/17 0025 06/18/17 0356  BP: (!) 115/59 (!) 103/92  Pulse: 77 80  Resp: 20 20  Temp: 36.4 C 36.6 C  SpO2: 97% 98%    Last Pain:  Vitals:   06/18/17 0846  TempSrc:   PainSc: [name]

## 2017-06-18 NOTE — Progress Notes (Signed)
Pt discharged with infant.  Discharge instructions, prescriptions and follow up appointment given to and reviewed with pt. Pt verbalized understanding. Escorted out by auxillary. 

## 2017-08-20 LAB — HM PAP SMEAR: HM Pap smear: NEGATIVE

## 2018-09-02 ENCOUNTER — Ambulatory Visit: Payer: Medicaid Other

## 2018-09-02 ENCOUNTER — Ambulatory Visit: Payer: Self-pay

## 2018-09-02 DIAGNOSIS — F40231 Fear of injections and transfusions: Secondary | ICD-10-CM

## 2018-09-03 ENCOUNTER — Ambulatory Visit (LOCAL_COMMUNITY_HEALTH_CENTER): Payer: Medicaid Other | Admitting: Advanced Practice Midwife

## 2018-09-03 ENCOUNTER — Other Ambulatory Visit: Payer: Self-pay

## 2018-09-03 VITALS — BP 134/97 | Ht 63.7 in | Wt 134.0 lb

## 2018-09-03 DIAGNOSIS — Z3009 Encounter for other general counseling and advice on contraception: Secondary | ICD-10-CM

## 2018-09-03 NOTE — Progress Notes (Signed)
In for yearly follow-up IUD; reports recent menses and cramping; interested in pregnancy test-"to make sure it's working" Carrie Frazier, Therapist, sports

## 2018-09-03 NOTE — Progress Notes (Signed)
   [place] problem visit  [place] Department  Subjective:  Carrie Frazier is a 23 y.o. G2P2 being seen today for confirmation of Mirena in place No chief complaint on file.   HPI   Does the patient have a current or past history of drug use? No   No components found for: HCV]   Health Maintenance Due  Topic Date Due  . TETANUS/TDAP  12/29/2013  . PAP-Cervical Cytology Screening  12/30/2015  . PAP SMEAR-Modifier  12/30/2015  . CHLAMYDIA SCREENING  01/29/2017  . INFLUENZA VACCINE  08/07/2018    ROS  The following portions of the patient's history were reviewed and updated as appropriate: allergies, current medications, past family history, past medical history, past social history, past surgical history and problem list. Problem list updated.   See flowsheet for other program required questions.  Objective:   Vitals:   09/03/18 1534  BP: (!) 134/97  Weight: 134 lb (60.8 kg)  Height: 5' 3.7" (1.618 m)    Physical Exam Constitutional:      Appearance: Normal appearance.  Abdominal:     Comments: Soft nontender abdomen, poor tone  Genitourinary:    Exam position: Lithotomy position.     Vagina: Vaginal discharge (white creamy leukorrhea) present.     Cervix: Normal.     Uterus: Normal.      Adnexa: Right adnexa normal and left adnexa normal.       Right: No mass or tenderness.         Left: No mass or tenderness.       Rectum: Normal.     Comments: IUD string visible  Neurological:     Mental Status: She is alert.       Assessment and Plan:  Carrie Frazier is a 23 y.o. female presenting to the [facility] Department for a Women's Health problem visit  1. Family planning Pt worried about Mirena (inserted 08/21/17) because had menses 08/23/18.  Last PE 08/2017.  Last sex 07/2018 without condom. Pt wants to do home pregnancy test in am.  BP 134/97. Nonsmoker.  Counseled to see primary care MD for  BP     No follow-ups on file.  No future appointments.  Herbie Saxon, CNM

## 2018-11-15 ENCOUNTER — Other Ambulatory Visit: Payer: Self-pay

## 2018-11-15 ENCOUNTER — Encounter: Payer: Self-pay | Admitting: Intensive Care

## 2018-11-15 ENCOUNTER — Emergency Department: Payer: Medicaid Other

## 2018-11-15 ENCOUNTER — Emergency Department
Admission: EM | Admit: 2018-11-15 | Discharge: 2018-11-15 | Disposition: A | Discharge status: Home / Self Care | Payer: Medicaid Other | Attending: Emergency Medicine | Admitting: Emergency Medicine

## 2018-11-15 DIAGNOSIS — F17210 Nicotine dependence, cigarettes, uncomplicated: Secondary | ICD-10-CM | POA: Insufficient documentation

## 2018-11-15 DIAGNOSIS — R102 Pelvic and perineal pain: Secondary | ICD-10-CM

## 2018-11-15 DIAGNOSIS — Z79899 Other long term (current) drug therapy: Secondary | ICD-10-CM | POA: Insufficient documentation

## 2018-11-15 DIAGNOSIS — N926 Irregular menstruation, unspecified: Secondary | ICD-10-CM

## 2018-11-15 LAB — URINALYSIS, COMPLETE (UACMP) WITH MICROSCOPIC
Bilirubin Urine: NEGATIVE
Glucose, UA: NEGATIVE mg/dL
Ketones, ur: NEGATIVE mg/dL
Leukocytes,Ua: NEGATIVE
Nitrite: NEGATIVE
Protein, ur: 100 mg/dL — AB
Specific Gravity, Urine: 1.04 — ABNORMAL HIGH (ref 1.005–1.030)
pH: 6 (ref 5.0–8.0)

## 2018-11-15 LAB — POCT PREGNANCY, URINE: Preg Test, Ur: NEGATIVE

## 2018-11-15 LAB — CBC WITH DIFFERENTIAL/PLATELET
Abs Immature Granulocytes: 0.02 10*3/uL (ref 0.00–0.07)
Basophils Absolute: 0 10*3/uL (ref 0.0–0.1)
Basophils Relative: 0 %
Eosinophils Absolute: 0.1 10*3/uL (ref 0.0–0.5)
Eosinophils Relative: 1 %
HCT: 39.2 % (ref 36.0–46.0)
Hemoglobin: 13.6 g/dL (ref 12.0–15.0)
Immature Granulocytes: 0 %
Lymphocytes Relative: 23 %
Lymphs Abs: 1.9 10*3/uL (ref 0.7–4.0)
MCH: 26.5 pg (ref 26.0–34.0)
MCHC: 34.7 g/dL (ref 30.0–36.0)
MCV: 76.3 fL — ABNORMAL LOW (ref 80.0–100.0)
Monocytes Absolute: 0.6 10*3/uL (ref 0.1–1.0)
Monocytes Relative: 7 %
Neutro Abs: 5.5 10*3/uL (ref 1.7–7.7)
Neutrophils Relative %: 69 %
Platelets: 257 10*3/uL (ref 150–400)
RBC: 5.14 MIL/uL — ABNORMAL HIGH (ref 3.87–5.11)
RDW: 13.9 % (ref 11.5–15.5)
WBC: 8.1 10*3/uL (ref 4.0–10.5)
nRBC: 0 % (ref 0.0–0.2)

## 2018-11-15 LAB — COMPREHENSIVE METABOLIC PANEL
ALT: 13 U/L (ref 0–44)
AST: 17 U/L (ref 15–41)
Albumin: 4 g/dL (ref 3.5–5.0)
Alkaline Phosphatase: 73 U/L (ref 38–126)
Anion gap: 8 (ref 5–15)
BUN: 15 mg/dL (ref 6–20)
CO2: 25 mmol/L (ref 22–32)
Calcium: 8.7 mg/dL — ABNORMAL LOW (ref 8.9–10.3)
Chloride: 108 mmol/L (ref 98–111)
Creatinine, Ser: 0.88 mg/dL (ref 0.44–1.00)
GFR calc Af Amer: >60 mL/min (ref >60)
GFR calc non Af Amer: >60 mL/min (ref >60)
Glucose, Bld: 98 mg/dL (ref 70–99)
Potassium: 3.8 mmol/L (ref 3.5–5.1)
Sodium: 141 mmol/L (ref 135–145)
Total Bilirubin: 0.7 mg/dL (ref 0.3–1.2)
Total Protein: 7.3 g/dL (ref 6.5–8.1)

## 2018-11-15 LAB — LIPASE, BLOOD: Lipase: 23 U/L (ref 11–51)

## 2018-11-15 MED ORDER — TRAMADOL HCL 50 MG PO TABS: 50.0000 mg | ORAL_TABLET | Freq: Four times a day (QID) | ORAL | 0 refills | Status: DC | PRN

## 2018-11-15 MED ORDER — OXYCODONE-ACETAMINOPHEN 5-325 MG PO TABS
1.0000 | ORAL_TABLET | Freq: Once | ORAL | Status: AC
  Administered 2018-11-15: 1 via ORAL
  Filled 2018-11-15: qty 1

## 2018-11-15 NOTE — ED Provider Notes (Signed)
[facility] Emergency Department Provider Note  ____________________________________________  Time seen: Approximately 6:15 PM  I have reviewed the triage vital signs and the nursing notes.   HISTORY  Chief Complaint Abdominal Pain   HPI   Carrie Frazier is a 23 y.o. female for treatment of pelvic pain with vaginal bleeding x 3 days. IUD x 1 year. Pain worse today. No relief with ibuprofen. No fever, nausea, diarrhea.  Past Medical History:  Diagnosis Date  . Anemia affecting pregnancy   . Depression   . 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 History of chlamydia     Patient Active Problem List   Diagnosis Date Noted  . Abdominal pain 03/18/2017  . 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 NO PAST SURGERIES      Prior to Admission medications   Medication Sig Start Date End Date Taking? Authorizing Provider  acetaminophen (TYLENOL) 500 MG tablet Take 2 tablets (1,000 mg total) by mouth every 6 (six) hours as needed for moderate pain. Patient not taking: Reported on 06/16/2017 04/22/17   Ward, Honor Loh, MD  ibuprofen (ADVIL,MOTRIN) 600 MG tablet Take 1 tablet (600 mg total) by mouth every 6 (six) hours. Patient not taking: Reported on 09/03/2018 06/18/17   McVey, Murray Hodgkins, CNM  levonorgestrel (MIRENA) 20 MCG/24HR IUD 1 each by Intrauterine route once. 08/21/17   Willaim Sheng, NP  Prenatal Vit-Fe Fumarate-FA (MULTIVITAMIN-PRENATAL) 27-0.8 MG TABS tablet Take 1 tablet by mouth daily at 12 noon.    [provider]  senna-docusate (SENOKOT-S) 8.6-50 MG tablet Take 2 tablets by mouth at bedtime. Patient not taking: Reported on 09/03/2018 06/18/17   McVey, Murray Hodgkins, CNM  traMADol (ULTRAM) 50 MG tablet Take 1 tablet (50 mg total) by mouth every 6 (six) hours as needed. 11/15/18 11/15/19  Gregor Hams, MD    Allergies Patient has no known allergies.  Family History  Problem Relation Age of Onset   . Diabetes Maternal Grandmother   . Hypertension Maternal Grandmother     Social History Social History   Tobacco Use  . Smoking status: Current Some Day Smoker    Types: Cigarettes  . Smokeless tobacco: Never Used  Substance Use Topics  . Alcohol use: Yes  . Drug use: Not Currently    Types: Marijuana    Comment: stopped in september    Review of Systems Constitutional: Negative for for fever. ENT: Negative for for sore throat. Respiratory: Negative for cough Gastrointestinal: Positive abdominal pain.  No nausea, no vomiting.  No diarrhea.  Musculoskeletal: Negative for generalized body aches. Skin: Negative for rash/lesion/wound. Neurological: Negative for headaches, focal weakness or numbness.  ____________________________________________   PHYSICAL EXAM:  VITAL SIGNS: Today's Vitals   11/15/18 1817 11/15/18 2153  BP: 138/87 (!) 123/59  Pulse: 85 75  Resp: 16 18  Temp: 98.2 F (36.8 C)   TempSrc: Oral   SpO2: 100% 99%  Weight: 61.2 kg   Height: 5' 3.5" (1.613 m)   PainSc: 10-Worst pain ever 10-Worst pain ever   Body mass index is 23.54 kg/m.                                                  Constitutional: Alert and oriented. Well appearing and in no acute distress. Cardiovascular:  Good peripheral circulation. Respiratory: Normal respiratory effort. Musculoskeletal: Full ROM throughout.  Neurologic:  Normal speech and language. No gross focal neurologic deficits are appreciated. Speech is normal. No gait instability. Gastrointestinal: Left lower quadrant pain. Skin:  Skin is warm, dry and intact. No rash noted. Psychiatric: Mood and affect are normal. Speech and behavior are normal.  ____________________________________________   LABS (all labs ordered are listed, but only abnormal results are displayed)  Labs Reviewed  URINALYSIS, COMPLETE (UACMP) WITH MICROSCOPIC - Abnormal; Notable for the following components:      Result  Value   Color, Urine YELLOW (*)    APPearance HAZY (*)    Specific Gravity, Urine 1.040 (*)    Hgb urine dipstick MODERATE (*)    Protein, ur 100 (*)    Bacteria, UA RARE (*)    All other components within normal limits  COMPREHENSIVE METABOLIC PANEL - Abnormal; Notable for the following components:   Calcium 8.7 (*)    All other components within normal limits  CBC WITH DIFFERENTIAL/PLATELET - Abnormal; Notable for the following components:   RBC 5.14 (*)    MCV 76.3 (*)    All other components within normal limits  LIPASE, BLOOD  POCT PREGNANCY, URINE  POC URINE PREG, ED   ____________________________________________  EKG ____________________________________________  RADIOLOGY ____________________________________________   PROCEDURES ____________________________________________   INITIAL IMPRESSION / ASSESSMENT AND PLAN / ED COURSE  Clinical Course as of Nov 14 2324  Mon Nov 15, 2018  1950 Urinalysis, Complete w Microscopic(!) [JE]    Clinical Course User Index [JE] Clayborn Heron, Student-PA    Pertinent labs & imaging results that were available during my care of the patient were reviewed by me and considered in my medical decision making (see chart for details).  Patient was advised to await ER room assignment.. ____________________________________________   FINAL CLINICAL IMPRESSION(S) / ED DIAGNOSES  Final diagnoses:  Pelvic pain  Irregular menstruation, unspecified       Chinita Pester, FNP 11/15/18 2326    Darci Current, MD 11/15/18 [phone]

## 2018-11-15 NOTE — ED Triage Notes (Signed)
Patient c/o sharp pelvic pain. Reports heavy bleeding X3 days. Has had IUD in place for about a year. No fevers. No N/V/D

## 2018-11-15 NOTE — ED Provider Notes (Signed)
[facility] Emergency Department Provider Note   First MD Initiated Contact with Patient 11/15/18 2047     (approximate)  I have reviewed the triage vital signs and the nursing notes.   HISTORY  Chief Complaint Abdominal Pain    HPI Carrie Frazier is a 23 y.o. female with below list of previous medical conditions presents to the emergency department secondary to left pelvic discomfort and vaginal bleeding x3 days.  Patient has an indwelling IUD x3 years.  Patient denies any nausea vomiting diarrhea constipation.  Patient denies any fever.  Patient denies any urinary symptoms.        Past Medical History:  Diagnosis Date  . Anemia affecting pregnancy   . Depression   . 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 History of chlamydia     Patient Active Problem List   Diagnosis Date Noted  . Abdominal pain 03/18/2017  . 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 NO PAST SURGERIES      Prior to Admission medications   Medication Sig Start Date End Date Taking? Authorizing Provider  acetaminophen (TYLENOL) 500 MG tablet Take 2 tablets (1,000 mg total) by mouth every 6 (six) hours as needed for moderate pain. Patient not taking: Reported on 06/16/2017 04/22/17   Ward, Elenora Fenderhelsea C, MD  ibuprofen (ADVIL,MOTRIN) 600 MG tablet Take 1 tablet (600 mg total) by mouth every 6 (six) hours. Patient not taking: Reported on 09/03/2018 06/18/17   McVey, Prudencio Pairebecca A, CNM  levonorgestrel (MIRENA) 20 MCG/24HR IUD 1 each by Intrauterine route once. 08/21/17   Tessa LernerLeath, Karla Wright, NP  Prenatal Vit-Fe Fumarate-FA (MULTIVITAMIN-PRENATAL) 27-0.8 MG TABS tablet Take 1 tablet by mouth daily at 12 noon.    [provider]  senna-docusate (SENOKOT-S) 8.6-50 MG tablet Take 2 tablets by mouth at bedtime. Patient not taking: Reported on 09/03/2018 06/18/17   McVey, Prudencio Pairebecca A, CNM    Allergies Patient has no known allergies.  Family History   Problem Relation Age of Onset  . Diabetes Maternal Grandmother   . Hypertension Maternal Grandmother     Social History Social History   Tobacco Use  . Smoking status: Current Some Day Smoker    Types: Cigarettes  . Smokeless tobacco: Never Used  Substance Use Topics  . Alcohol use: Yes  . Drug use: Not Currently    Types: Marijuana    Comment: stopped in september    Review of Systems Constitutional: No fever/chills Eyes: No visual changes. ENT: No sore throat. Cardiovascular: Denies chest pain. Respiratory: Denies shortness of breath. Gastrointestinal: No abdominal pain.  No nausea, no vomiting.  No diarrhea.  No constipation. Genitourinary: Negative for dysuria.  Positive for left pelvic pain Musculoskeletal: Negative for neck pain.  Negative for back pain. Integumentary: Negative for rash. Neurological: Negative for headaches, focal weakness or numbness.  ____________________________________________   PHYSICAL EXAM:  VITAL SIGNS: ED Triage Vitals [11/15/18 1817]  Enc Vitals Group     BP 138/87     Pulse Rate 85     Resp 16     Temp 98.2 F (36.8 C)     Temp Source Oral     SpO2 100 %     Weight 61.2 kg (135 lb)     Height 1.613 m (5' 3.5")     Head Circumference      Peak Flow      Pain Score 10     Pain  Loc      Pain Edu?      Excl. in [place]?     Constitutional: Alert and oriented.  Eyes: Conjunctivae are normal.  Mouth/Throat: Patient is wearing a mask. Neck: No stridor.  No meningeal signs.   Cardiovascular: Normal rate, regular rhythm. Good peripheral circulation. Grossly normal heart sounds. Respiratory: Normal respiratory effort.  No retractions. Gastrointestinal: Soft and nontender. No distention.  Genitourinary Musculoskeletal: No lower extremity tenderness nor edema. No gross deformities of extremities. Neurologic:  Normal speech and language. No gross focal neurologic deficits are appreciated.  Skin:  Skin is warm, dry and intact.  Psychiatric: Mood and affect are normal. Speech and behavior are normal.  ____________________________________________   LABS (all labs ordered are listed, but only abnormal results are displayed)  Labs Reviewed  URINALYSIS, COMPLETE (UACMP) WITH MICROSCOPIC - Abnormal; Notable for the following components:      Result Value   Color, Urine YELLOW (*)    APPearance HAZY (*)    Specific Gravity, Urine 1.040 (*)    Hgb urine dipstick MODERATE (*)    Protein, ur 100 (*)    Bacteria, UA RARE (*)    All other components within normal limits  COMPREHENSIVE METABOLIC PANEL - Abnormal; Notable for the following components:   Calcium 8.7 (*)    All other components within normal limits  CBC WITH DIFFERENTIAL/PLATELET - Abnormal; Notable for the following components:   RBC 5.14 (*)    MCV 76.3 (*)    All other components within normal limits  LIPASE, BLOOD  POCT PREGNANCY, URINE  POC URINE PREG, ED    RADIOLOGY I, Carrie Frazier, personally viewed and evaluated these images (plain radiographs) as part of my medical decision making, as well as reviewing the written report by the radiologist.  ED MD interpretation: IUD is noted in place no other focal abnormality noted on pelvic ultrasound per radiologist.  Official radiology report(s): US Pelvic Complete With Transvaginal  Result Date: 11/15/2018 CLINICAL DATA:  Sharp pelvic pain for several weeks EXAM: TRANSABDOMINAL AND TRANSVAGINAL ULTRASOUND OF PELVIS TECHNIQUE: Both transabdominal and transvaginal ultrasound examinations of the pelvis were performed. Transabdominal technique was performed for global imaging of the pelvis including uterus, ovaries, adnexal regions, and pelvic cul-de-sac. It was necessary to proceed with endovaginal exam following the transabdominal exam to visualize the ovaries. COMPARISON:  None FINDINGS: Uterus Measurements: 7.8 x 2.8 x 5.8 cm. = volume: 67 mL. No fibroids or other mass visualized. Endometrium  Thickness: 5 mm.  IUD is noted in place. Right ovary Measurements: 1.8 x 1.8 x 1.7 cm = volume: 3 mL. Normal appearance/no adnexal mass. Normal color flow is noted. Left ovary Measurements: 2.4 x 2.1 x 2.7 cm = volume: 7.2 mL. Normal appearance/no adnexal mass. Normal color flow is noted. Other findings No abnormal free fluid. IMPRESSION: IUD is noted in place. No other focal abnormality is noted. Electronically Signed   By: Carrie Catalina M.D.   On: 11/15/2018 20:54    ____________________________________________   PROCEDURES   Procedure(s) performed (including Critical Care):  Procedures   ____________________________________________   INITIAL IMPRESSION / MDM / [place] / ED COURSE  As part of my medical decision making, I reviewed the following data within the electronic MEDICAL RECORD NUMBER  23 year old female presenting with above-stated history and physical exam secondary to vaginal bleeding and pelvic discomfort.  Ultrasound revealed no gross abnormality.  Laboratory data including urinalysis unremarkable.  Patient states that she had a pelvic  exam performed in August which was negative.  Suspect patient symptoms to be secondary to menstruation.  Patient given Ultram for pain with referral to gynecology.   Clinical Course as of Nov 14 2117  [facility] Nov 15, 2018  1950 Urinalysis, Complete w Microscopic(!) [JE]    Clinical Course User Index [JE] Carrie Frazier, Student-PA     ____________________________________________  FINAL CLINICAL IMPRESSION(S) / ED DIAGNOSES  Final diagnoses:  Pelvic pain     MEDICATIONS GIVEN DURING THIS VISIT:  Medications  oxyCODONE-acetaminophen (PERCOCET/ROXICET) 5-325 MG per tablet 1 tablet (has no administration in time range)     ED Discharge Orders    None      *Please note:  Teah Nichele Ida was evaluated in Emergency Department on 11/15/2018 for the symptoms described in the history of present illness. She was  evaluated in the context of the global COVID-19 pandemic, which necessitated consideration that the patient might be at risk for infection with the SARS-CoV-2 virus that causes COVID-19. Institutional protocols and algorithms that pertain to the evaluation of patients at risk for COVID-19 are in a state of rapid change based on information released by regulatory bodies including the CDC and federal and state organizations. These policies and algorithms were followed during the patient's care in the ED.  Some ED evaluations and interventions may be delayed as a result of limited staffing during the pandemic.*  Note:  This document was prepared using Dragon voice recognition software and may include unintentional dictation errors.   Darci Current, MD 11/15/18 [phone]

## 2019-01-13 ENCOUNTER — Ambulatory Visit (LOCAL_COMMUNITY_HEALTH_CENTER): Payer: Medicaid Other | Admitting: Physician Assistant

## 2019-01-13 ENCOUNTER — Encounter: Payer: Self-pay | Admitting: Physician Assistant

## 2019-01-13 ENCOUNTER — Encounter: Payer: Self-pay | Admitting: Emergency Medicine

## 2019-01-13 ENCOUNTER — Other Ambulatory Visit: Payer: Self-pay

## 2019-01-13 VITALS — BP 131/72 | Ht 63.5 in | Wt 133.6 lb

## 2019-01-13 DIAGNOSIS — R102 Pelvic and perineal pain: Secondary | ICD-10-CM | POA: Diagnosis not present

## 2019-01-13 DIAGNOSIS — Z3009 Encounter for other general counseling and advice on contraception: Secondary | ICD-10-CM | POA: Diagnosis not present

## 2019-01-13 DIAGNOSIS — Z309 Encounter for contraceptive management, unspecified: Secondary | ICD-10-CM

## 2019-01-13 DIAGNOSIS — Z79899 Other long term (current) drug therapy: Secondary | ICD-10-CM | POA: Insufficient documentation

## 2019-01-13 DIAGNOSIS — F17210 Nicotine dependence, cigarettes, uncomplicated: Secondary | ICD-10-CM | POA: Insufficient documentation

## 2019-01-13 DIAGNOSIS — N939 Abnormal uterine and vaginal bleeding, unspecified: Secondary | ICD-10-CM | POA: Insufficient documentation

## 2019-01-13 LAB — CBC WITH DIFFERENTIAL/PLATELET
Abs Immature Granulocytes: 0.02 10*3/uL (ref 0.00–0.07)
Basophils Absolute: 0 10*3/uL (ref 0.0–0.1)
Basophils Relative: 1 %
Eosinophils Absolute: 0.1 10*3/uL (ref 0.0–0.5)
Eosinophils Relative: 1 %
HCT: 36.2 % (ref 36.0–46.0)
Hemoglobin: 12.5 g/dL (ref 12.0–15.0)
Immature Granulocytes: 0 %
Lymphocytes Relative: 38 %
Lymphs Abs: 2.5 10*3/uL (ref 0.7–4.0)
MCH: 26.5 pg (ref 26.0–34.0)
MCHC: 34.5 g/dL (ref 30.0–36.0)
MCV: 76.7 fL — ABNORMAL LOW (ref 80.0–100.0)
Monocytes Absolute: 0.5 10*3/uL (ref 0.1–1.0)
Monocytes Relative: 7 %
Neutro Abs: 3.5 10*3/uL (ref 1.7–7.7)
Neutrophils Relative %: 53 %
Platelets: 248 10*3/uL (ref 150–400)
RBC: 4.72 MIL/uL (ref 3.87–5.11)
RDW: 14.1 % (ref 11.5–15.5)
WBC: 6.6 10*3/uL (ref 4.0–10.5)
nRBC: 0 % (ref 0.0–0.2)

## 2019-01-13 MED ORDER — MULTIVITAMINS PO CAPS
1.0000 | ORAL_CAPSULE | Freq: Every day | ORAL | 0 refills | Status: AC
Start: 2019-01-13 — End: 2019-04-23

## 2019-01-13 NOTE — Progress Notes (Signed)
[place] problem visit  [place] Department  Subjective:  Carrie Frazier is a 24 y.o. being seen today for   Chief Complaint  Patient presents with  . Procedure    IUD removal    24yo woman here for Mirena IUD removal. She attributes her heavy menses and pelvic cramping to the IUD. Had IUD inserted 09/04/17 & had no menses or cramps for several months. In early 2020, started having some irreg light bleeding. About 3 mo ago, developed heavier bleeding and pelvic cramping. Had ED eval of these sx on 11/15/18, and had normal pelvic US showing IUD in place and no abnormalities. Has tried several doses of 800mg  ibuprofen with minimal relief. Declines STI eval. Last sex about 2 weeks ago, was uncomfortable. Of note, states she has migraine headaches, but when questioned further, this is a self diagnosis.    Does the patient have a current or past history of drug use? No   No components found for: HCV]   Health Maintenance Due  Topic Date Due  . CHLAMYDIA SCREENING  12/29/2009  . TETANUS/TDAP  12/29/2013  . INFLUENZA VACCINE  08/07/2018    ROS - see Routine Health Survey.   The following portions of the patient's history were reviewed and updated as appropriate: allergies, current medications, past family history, past medical history, past social history, past surgical history and problem list. Problem list updated.   See flowsheet for other program required questions.  Objective:   Vitals:   01/13/19 1519  BP: 131/72  Weight: 133 lb 9.6 oz (60.6 kg)  Height: 5' 3.5" (1.613 m)    Physical Exam Constitutional:      Appearance: Normal appearance. She is normal weight.     Comments: Appears uncomfortable.  Cardiovascular:     Pulses: Normal pulses.  Pulmonary:     Effort: Pulmonary effort is normal.  Abdominal:     Palpations: Abdomen is soft.  Genitourinary:    General: Normal vulva.     Pubic Area: No rash.      Labia:      Right: No rash, tenderness or lesion.        Left: No rash, tenderness or lesion.      Urethra: No urethral pain, urethral swelling or urethral lesion.     Vagina: Bleeding present. No tenderness, lesions or prolapsed vaginal walls.     Cervix: Cervical bleeding present. No lesion or erythema.     Uterus: Normal.      Adnexa:        Right: No mass or tenderness.         Left: No mass or tenderness.       Rectum: External hemorrhoid present.     Comments: Mod amt blood in vaginal vault. IUD strings seen easily during speculum exam. Lymphadenopathy:     Lower Body: No right inguinal adenopathy. No left inguinal adenopathy.  Skin:    General: Skin is warm and dry.     Findings: No rash.  Neurological:     Mental Status: She is alert and oriented to person, place, and time.  Psychiatric:        Mood and Affect: Mood normal.        Behavior: Behavior normal.        Thought Content: Thought content normal.        Judgment: Judgment normal.     IUD Removal  Patient identified, informed consent performed, consent signed.  Patient  was in the dorsal lithotomy position, normal external genitalia was noted.  A speculum was placed in the patient's vagina, blood was noted, no lesions. The cervix was visualized, no lesions, no abnormal discharge.  The strings of the IUD were grasped and pulled using ring forceps. The IUD was removed in its entirety. Patient tolerated the procedure well.    Patient is undecided for contraception/does not plan for pregnancy soon ("maybe in 2-3 years") and she was told to avoid teratogens, take PNV and folic acid.  Routine preventative health maintenance measures emphasized.     Assessment and Plan:  Carrie Frazier is a 24 y.o. female presenting to the [facility] Department for a Women's Health problem visit  1. Encounter for contraceptive management, unspecified type Pt with metomenorrhagia, and requests Mirena IUD removal, which was done  after confirming negative pregnancy test. Pt to try ibuprofen 800 mg q 6h for 3 days to assist with bleeding and cramping, and to consider other contraceptive options. Plan to reassess HA history and verify no migraines if considering COCPs. - Pregnancy, urine     Return in about 4 weeks (around 02/10/2019) for reassess sx and contraceptive plan.  No future appointments.  Landry Dyke, PA-C

## 2019-01-13 NOTE — Progress Notes (Addendum)
Here today for IUD removal. Mirena inserted 08/21/2017. Would like to discuss alternate birth control options. Last PE was 08/2018 and last Pap Smear was 08/20/2017. Tawny Hopping, RN

## 2019-01-13 NOTE — ED Triage Notes (Signed)
Patient ambulatory to triage with steady gait, without difficulty or distress noted, mask in place; pt reports vag bleeding since November with pelvic pain; st her IUD was removed today at [facility] and was told "they didn't know why I was bleeding"

## 2019-01-14 ENCOUNTER — Emergency Department
Admission: EM | Admit: 2019-01-14 | Discharge: 2019-01-14 | Disposition: A | Discharge status: Home / Self Care | Payer: Medicaid Other | Attending: Emergency Medicine | Admitting: Emergency Medicine

## 2019-01-14 ENCOUNTER — Ambulatory Visit: Payer: Self-pay | Admitting: *Deleted

## 2019-01-14 DIAGNOSIS — N939 Abnormal uterine and vaginal bleeding, unspecified: Secondary | ICD-10-CM

## 2019-01-14 LAB — URINALYSIS, COMPLETE (UACMP) WITH MICROSCOPIC
Bilirubin Urine: NEGATIVE
Glucose, UA: NEGATIVE mg/dL
Ketones, ur: NEGATIVE mg/dL
Leukocytes,Ua: NEGATIVE
Nitrite: NEGATIVE
Protein, ur: 100 mg/dL — AB
RBC / HPF: >50 RBC/hpf — ABNORMAL HIGH (ref 0–5)
Specific Gravity, Urine: 1.026 (ref 1.005–1.030)
pH: 6 (ref 5.0–8.0)

## 2019-01-14 LAB — COMPREHENSIVE METABOLIC PANEL
ALT: 15 U/L (ref 0–44)
AST: 16 U/L (ref 15–41)
Albumin: 3.8 g/dL (ref 3.5–5.0)
Alkaline Phosphatase: 68 U/L (ref 38–126)
Anion gap: 7 (ref 5–15)
BUN: 15 mg/dL (ref 6–20)
CO2: 26 mmol/L (ref 22–32)
Calcium: 8.6 mg/dL — ABNORMAL LOW (ref 8.9–10.3)
Chloride: 107 mmol/L (ref 98–111)
Creatinine, Ser: 0.77 mg/dL (ref 0.44–1.00)
GFR calc Af Amer: >60 mL/min (ref >60)
GFR calc non Af Amer: >60 mL/min (ref >60)
Glucose, Bld: 91 mg/dL (ref 70–99)
Potassium: 3.5 mmol/L (ref 3.5–5.1)
Sodium: 140 mmol/L (ref 135–145)
Total Bilirubin: 0.6 mg/dL (ref 0.3–1.2)
Total Protein: 6.8 g/dL (ref 6.5–8.1)

## 2019-01-14 LAB — SAMPLE TO BLOOD BANK

## 2019-01-14 LAB — PREGNANCY, URINE: Preg Test, Ur: NEGATIVE

## 2019-01-14 LAB — HCG, QUANTITATIVE, PREGNANCY: hCG, Beta Chain, Quant, S: <1 m[IU]/mL (ref <5)

## 2019-01-14 NOTE — ED Notes (Signed)
Upon entering pt's room to assess and get vitals, pt is not visualized. Will reassess.

## 2019-01-14 NOTE — ED Provider Notes (Signed)
[facility] Emergency Department Provider Note  ____________________________________________  Time seen: Approximately 12:52 AM  I have reviewed the triage vital signs and the nursing notes.   HISTORY  Chief Complaint Vaginal Bleeding   HPI Carrie Frazier is a 24 y.o. female with a history of chlamydia, hemoglobin C trait, depression who presents for evaluation of vaginal bleeding.   Patient has had vaginal bleeding since November with pelvic pain.  Was seen here for the same 2 months ago with a negative ultrasound.  Patient was convinced that her problems were because of her IUD.  She had her IUD removed this morning.  She reports that the bleeding has not subsided.  She reports that her doctor who removed the IUD did not tell her that it may take several days for her hormones to stabilize so the bleeding would stop.  She describes the bleeding as a menstrual period at times and at times just spotting.  No abdominal pain, no dizziness, no chest pain or syncope.  She has not undergone STD screening for this bleeding.  Past Medical History:  Diagnosis Date  . Anemia affecting pregnancy   . Depression   . Hemoglobin C trait ([place])   . History of chlamydia     Patient Active Problem List   Diagnosis Date Noted  . Abdominal pain 03/18/2017  . 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 NO PAST SURGERIES      Prior to Admission medications   Medication Sig Start Date End Date Taking? Authorizing Provider  acetaminophen (TYLENOL) 500 MG tablet Take 2 tablets (1,000 mg total) by mouth every 6 (six) hours as needed for moderate pain. 04/22/17   Ward, Honor Loh, MD  ibuprofen (ADVIL,MOTRIN) 600 MG tablet Take 1 tablet (600 mg total) by mouth every 6 (six) hours. 06/18/17   McVey, Murray Hodgkins, CNM  levonorgestrel (MIRENA) 20 MCG/24HR IUD 1 each by Intrauterine route once. 08/21/17   Willaim Sheng, NP  Multiple  Vitamin (MULTIVITAMIN) capsule Take 1 capsule by mouth daily for 100 doses. 01/13/19 04/23/19  Caren Macadam, MD  Prenatal Vit-Fe Fumarate-FA (MULTIVITAMIN-PRENATAL) 27-0.8 MG TABS tablet Take 1 tablet by mouth daily at 12 noon.    [provider]  senna-docusate (SENOKOT-S) 8.6-50 MG tablet Take 2 tablets by mouth at bedtime. Patient not taking: Reported on 09/03/2018 06/18/17   McVey, Murray Hodgkins, CNM  traMADol (ULTRAM) 50 MG tablet Take 1 tablet (50 mg total) by mouth every 6 (six) hours as needed. Patient not taking: Reported on 01/13/2019 11/15/18 11/15/19  Gregor Hams, MD    Allergies Patient has no known allergies.  Family History  Problem Relation Age of Onset  . Diabetes Maternal Grandmother   . Hypertension Maternal Grandmother     Social History Social History   Tobacco Use  . Smoking status: Current Some Day Smoker    Types: Cigarettes  . Smokeless tobacco: Never Used  Substance Use Topics  . Alcohol use: Yes  . Drug use: Not Currently    Types: Marijuana    Comment: stopped in september    Review of Systems  Constitutional: Negative for fever. Eyes: Negative for visual changes. ENT: Negative for sore throat. Neck: No neck pain  Cardiovascular: Negative for chest pain. Respiratory: Negative for shortness of breath. Gastrointestinal: Negative for abdominal pain, vomiting or diarrhea. Genitourinary: Negative for dysuria. + vaginal bleeding Musculoskeletal: Negative for back pain. Skin: Negative for  rash. Neurological: Negative for headaches, weakness or numbness. Psych: No SI or HI  ____________________________________________   PHYSICAL EXAM:  VITAL SIGNS: ED Triage Vitals  Enc Vitals Group     BP 01/13/19 2310 125/73     Pulse Rate 01/13/19 2310 66     Resp 01/13/19 2310 18     Temp 01/13/19 2310 98.3 F (36.8 C)     Temp Source 01/13/19 2310 Oral     SpO2 01/13/19 2310 99 %     Weight 01/13/19 2307 133 lb (60.3 kg)     Height  01/13/19 2307 5\' 3"  (1.6 m)     Head Circumference --      Peak Flow --      Pain Score 01/13/19 2307 10     Pain Loc --      Pain Edu? --      Excl. in GC? --     Constitutional: Alert and oriented. Well appearing and in no apparent distress. HEENT:      Head: Normocephalic and atraumatic.         Eyes: Conjunctivae are normal. Sclera is non-icteric.       Mouth/Throat: Mucous membranes are moist.       Neck: Supple with no signs of meningismus. Cardiovascular: Regular rate and rhythm.  Respiratory: Normal respiratory effort.  Gastrointestinal: Soft, non tender, and non distended with positive bowel sounds. No rebound or guarding. Musculoskeletal: No edema, cyanosis, or erythema of extremities. Neurologic: Normal speech and language. Face is symmetric. Moving all extremities. No gross focal neurologic deficits are appreciated. Skin: Skin is warm, dry and intact. No rash noted. Psychiatric: Mood and affect are normal. Speech and behavior are normal.  ____________________________________________   LABS (all labs ordered are listed, but only abnormal results are displayed)  Labs Reviewed  CBC WITH DIFFERENTIAL/PLATELET - Abnormal; Notable for the following components:      Result Value   MCV 76.7 (*)    All other components within normal limits  COMPREHENSIVE METABOLIC PANEL - Abnormal; Notable for the following components:   Calcium 8.6 (*)    All other components within normal limits  HCG, QUANTITATIVE, PREGNANCY  URINALYSIS, COMPLETE (UACMP) WITH MICROSCOPIC  SAMPLE TO BLOOD BANK   ____________________________________________  EKG  none  ____________________________________________  RADIOLOGY  none  ____________________________________________   PROCEDURES  Procedure(s) performed: None Procedures Critical Care performed:  None ____________________________________________   INITIAL IMPRESSION / ASSESSMENT AND PLAN / ED COURSE   24 y.o. female with a  history of chlamydia, hemoglobin C trait, depression who presents for evaluation of abnormal uterine bleeding since November.  Patient was seen here 2 months ago with a negative ultrasound.  She is hemodynamically stable with stable hemoglobin.  She continues to have bleeding and had her IUD removed this morning.  I explained to her that it may take several days if not a week or 2 for her hormones to stabilize after the removal of IUD.  I recommended STD testing however patient declined.  I discussed follow-up with OB/GYN in 1 week if her symptoms are persistent for possible initiation of OCPs and further evaluation of this bleeding.  I discussed signs and symptoms of acute blood loss anemia and recommended return if those persist. Patient did not wish to stay for the results of her UA       As part of my medical decision making, I reviewed the following data within the electronic MEDICAL RECORD NUMBER Nursing notes reviewed and incorporated, Labs  reviewed , Old chart reviewed, Radiograph reviewed , Notes from prior ED visits and  Controlled Substance Database   Please note:  Patient was evaluated in Emergency Department today for the symptoms described in the history of present illness. Patient was evaluated in the context of the global COVID-19 pandemic, which necessitated consideration that the patient might be at risk for infection with the SARS-CoV-2 virus that causes COVID-19. Institutional protocols and algorithms that pertain to the evaluation of patients at risk for COVID-19 are in a state of rapid change based on information released by regulatory bodies including the CDC and federal and state organizations. These policies and algorithms were followed during the patient's care in the ED.  Some ED evaluations and interventions may be delayed as a result of limited staffing during the pandemic.   ____________________________________________   FINAL CLINICAL IMPRESSION(S) / ED DIAGNOSES   Final  diagnoses:  Vaginal bleeding      NEW MEDICATIONS STARTED DURING THIS VISIT:  ED Discharge Orders    None       Note:  This document was prepared using Dragon voice recognition software and may include unintentional dictation errors.    Don Perking, [place], MD 01/14/19 [phone]

## 2019-01-14 NOTE — Telephone Encounter (Signed)
Patient is calling to report she has been bleeding since 11/9 without stopping. [place] was found to be normal in November and patient blood work is normal as well. Patient states she is bleeding heavy and in pain. Advised OB/GYN follow up to ED visit- or ED per protocol.  Reason for Disposition . SEVERE vaginal bleeding (e.g., soaking 2 pads or tampons per hour and present 2 or more hours; 1 menstrual cup every 2 hours)  Answer Assessment - Initial Assessment Questions 1. AMOUNT: "Describe the bleeding that you are having."    - SPOTTING: spotting, or pinkish / brownish mucous discharge; does not fill panti-liner or pad    - MILD:  less than 1 pad / hour; less than patient's usual menstrual bleeding   - MODERATE: 1-2 pads / hour; 1 menstrual cup every 6 hours; small-medium blood clots (e.g., pea, grape, small coin)   - SEVERE: soaking 2 or more pads/hour for 2 or more hours; 1 menstrual cup every 2 hours; bleeding not contained by pads or continuous red blood from vagina; large blood clots (e.g., golf ball, large coin)      severe 2. ONSET: "When did the bleeding begin?" "Is it continuing now?"     11/9- daily bleeding 3. MENSTRUAL PERIOD: "When was the last normal menstrual period?" "How is this different than your period?"     Mirena- 08/2017, patient normally does not have cycle 4. REGULARITY: "How regular are your periods?"     IUD- no cycle 5. ABDOMINAL PAIN: "Do you have any pain?" "How bad is the pain?"  (e.g., Scale 1-10; mild, moderate, or severe)   - MILD (1-3): doesn't interfere with normal activities, abdomen soft and not tender to touch    - MODERATE (4-7): interferes with normal activities or awakens from sleep, tender to touch    - SEVERE (8-10): excruciating pain, doubled over, unable to do any normal activities      10 6. PREGNANCY: "Could you be pregnant?" "Are you sexually active?" "Did you recently give birth?"     IUD, yes-SA, 06/2017 7. BREASTFEEDING: "Are you  breastfeeding?"     no 8. HORMONES: "Are you taking any hormone medications, prescription or OTC?" (e.g., birth control pills, estrogen)     IUD 9. BLOOD THINNERS: "Do you take any blood thinners?" (e.g., Coumadin/warfarin, Pradaxa/dabigatran, aspirin)     no 10. CAUSE: "What do you think is causing the bleeding?" (e.g., recent gyn surgery, recent gyn procedure; known bleeding disorder, cervical cancer, polycystic ovarian disease, fibroids)         unknown 11. HEMODYNAMIC STATUS: "Are you weak or feeling lightheaded?" If so, ask: "Can you stand and walk normally?"        Weak- nauseous 12. OTHER SYMPTOMS: "What other symptoms are you having with the bleeding?" (e.g., passed tissue, vaginal discharge, fever, menstrual-type cramps)       Weird smell, cramps  Protocols used: VAGINAL BLEEDING - ABNORMAL-A-AH

## 2019-01-18 ENCOUNTER — Telehealth: Payer: Self-pay | Admitting: General Practice

## 2019-01-18 NOTE — Telephone Encounter (Signed)
Called pt trying to sch appt for ED visit pt doesn't have a VM. Couldn't lm

## 2019-01-19 ENCOUNTER — Encounter: Payer: Self-pay | Admitting: Obstetrics and Gynecology

## 2019-01-25 ENCOUNTER — Ambulatory Visit: Payer: Self-pay | Admitting: Physician Assistant

## 2019-01-25 ENCOUNTER — Ambulatory Visit (LOCAL_COMMUNITY_HEALTH_CENTER): Payer: Medicaid Other | Admitting: Physician Assistant

## 2019-01-25 ENCOUNTER — Other Ambulatory Visit: Payer: Self-pay

## 2019-01-25 VITALS — BP 128/74 | Wt 132.0 lb

## 2019-01-25 DIAGNOSIS — Z3009 Encounter for other general counseling and advice on contraception: Secondary | ICD-10-CM

## 2019-01-25 DIAGNOSIS — Z30018 Encounter for initial prescription of other contraceptives: Secondary | ICD-10-CM

## 2019-01-25 DIAGNOSIS — Z30012 Encounter for prescription of emergency contraception: Secondary | ICD-10-CM | POA: Diagnosis not present

## 2019-01-25 DIAGNOSIS — Z113 Encounter for screening for infections with a predominantly sexual mode of transmission: Secondary | ICD-10-CM

## 2019-01-25 MED ORDER — LEVONORGESTREL 1.5 MG PO TABS: 1.5000 mg | ORAL_TABLET | Freq: Once | ORAL | 0 refills | Status: AC

## 2019-01-25 MED ORDER — ETONOGESTREL-ETHINYL ESTRADIOL 0.12-0.015 MG/24HR VA RING: VAGINAL_RING | VAGINAL | 3 refills | Status: DC

## 2019-01-26 ENCOUNTER — Encounter: Payer: Self-pay | Admitting: Physician Assistant

## 2019-01-26 LAB — WET PREP FOR TRICH, YEAST, CLUE
Trichomonas Exam: NEGATIVE
Yeast Exam: NEGATIVE

## 2019-01-26 NOTE — Progress Notes (Signed)
Patient is into clinic for IS and requests Rx for [facility].  Patient counseled re:  Risks, benefits, and SE of hormonal BCM.  Patient declines OCs, patch, Depo, and Nexplanon and states that she just had her IUD removed about 2 weeks ago.  Opts to try NuvaRing and Rx will be sent to her pharmacy of choice.  Also, patient had unprotected sex 01/22/2019, and requests ECP also be sent to pharmacy.  See IS note for this date for specifics.

## 2019-01-27 NOTE — Progress Notes (Signed)
[facility] Department STI clinic/screening visit  Subjective:  Carrie Frazier is a 24 y.o. female being seen today for an STI screening visit. The patient reports they do not have symptoms.  Patient reports that they do not desire a pregnancy in the next year.   They reported they are interested in discussing contraception today.  Patient's last menstrual period was 01/13/2019 (exact date).   Patient has the following medical conditions:   Patient Active Problem List   Diagnosis Date Noted  . Abdominal pain 03/18/2017  . 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 SEXUALLY TRANSMITTED DISEASE    HPI  Patient reports that she had unprotected sex on 01/22/2019 and would like a screening.  Also reports that she does not want to get pregnant but had her IUD removed due to irregular bleeding about 2 weeks ago.  Would like to start another method but is not sure about which one to use.  Denies any vaginal symptoms.  Asks about testing for HSV due to "hearing" that a partner has this infection.  See flowsheet for further details and programmatic requirements.    The following portions of the patient's history were reviewed and updated as appropriate: allergies, current medications, past medical history, past social history, past surgical history and problem list.  Objective:   Vitals:   01/25/19 1620  BP: 128/74  Weight: 132 lb (59.9 kg)    Physical Exam Vitals reviewed.  Constitutional:      General: She is not in acute distress.    Appearance: Normal appearance. She is normal weight.  HENT:     Head: Normocephalic and atraumatic.     Comments: No nits, lice, or hair loss. No cervical, supraclavicular or axillary adenopathy.    Mouth/Throat:     Mouth: Mucous membranes are moist.     Pharynx: Oropharynx is clear. No oropharyngeal exudate or posterior oropharyngeal erythema.  Eyes:     Conjunctiva/sclera: Conjunctivae normal.   Pulmonary:     Effort: Pulmonary effort is normal.  Abdominal:     Palpations: Abdomen is soft. There is no mass.     Tenderness: There is no abdominal tenderness. There is no guarding or rebound.  Genitourinary:    General: Normal vulva.     Rectum: Normal.     Comments: External genitalia/pubic area without nits, lice, edema, erythema, lesions and inguinal adenopathy. Vagina with normal mucosa and discharge. Cervix without visible lesions. Uterus firm, mobile, nt, no masses, no CMT, no adnexal tenderness or fullness. Musculoskeletal:     Cervical back: Neck supple. No tenderness.  Skin:    General: Skin is warm and dry.     Findings: No bruising, erythema, lesion or rash.  Neurological:     Mental Status: She is alert and oriented to person, place, and time.  Psychiatric:        Mood and Affect: Mood normal.        Behavior: Behavior normal.        Thought Content: Thought content normal.        Judgment: Judgment normal.      Assessment and Plan:  Carrie Frazier is a 24 y.o. female presenting to the [facility] Department for STI screening  1. Screening for STD (sexually transmitted disease) Patient into clinic without symptoms.  Declines blood work today. Rec condoms with all sex. Await test results.  Counseled that RN will call if needs to RTC  for treatment once results are back. Counseled re:  HSV-dz, transmission, s/s, subclinical dz and treatment options.  Counseled that she can have a culture done if she has lesions at [facility] or see her PCP for serology. - WET PREP FOR [place], YEAST, [facility]  2. Encounter for counseling regarding contraception Counseled patient re:  Risks, benefits, and SE of BCMs.  Patient opts for trying NuvaRing. Rx for NuvaRing sent to patient's pharmacy of choice with refills x 3. Written info also given to patient to review. Call/RTC with questions or concerns. - etonogestrel-ethinyl estradiol  (NUVARING) 0.12-0.015 MG/24HR vaginal ring; Insert vaginally and leave in place for 3 consecutive weeks, then remove for 1 week.  Dispense: 1 each; Refill: 3 - levonorgestrel (PLAN B 1-STEP) 1.5 MG tablet; Take 1 tablet (1.5 mg total) by mouth once for 1 dose.  Dispense: 1 tablet; Refill: 0  3. Encounter for emergency contraceptive counseling and prescription Patient had unprotected sex 01/22/2019, and requests ECP be sent to pharmacy as well. Patient counseled to take Plan B ASAP today and insert NuvaRing tomorrow. Rx for Plan B sent to patient's pharmacy of choice. - levonorgestrel (PLAN B 1-STEP) 1.5 MG tablet; Take 1 tablet (1.5 mg total) by mouth once for 1 dose.  Dispense: 1 tablet; Refill: 0  4. Encounter for prescription for nuvaring Counseled patient to start NuvaRing tomorrow after taking Plan B today. Counseled to use condoms for 2 weeks after ECP and check an OTC pregnancy test in 2 weeks. Counseled to RTC if the pregnancy test in 2 weeks is positive. - etonogestrel-ethinyl estradiol (NUVARING) 0.12-0.015 MG/24HR vaginal ring; Insert vaginally and leave in place for 3 consecutive weeks, then remove for 1 week.  Dispense: 1 each; Refill: 3     No follow-ups on file.  Future Appointments  Date Time Provider [place]  02/16/2019  3:00 PM Harlin Heys, MD [place] None    Jerene Dilling, [place]

## 2019-02-16 ENCOUNTER — Encounter: Payer: Medicaid Other | Admitting: Obstetrics and Gynecology

## 2019-02-22 ENCOUNTER — Ambulatory Visit: Payer: Medicaid Other

## 2019-03-03 ENCOUNTER — Ambulatory Visit: Payer: Medicaid Other

## 2019-03-10 ENCOUNTER — Other Ambulatory Visit: Payer: Self-pay

## 2019-03-10 ENCOUNTER — Ambulatory Visit (LOCAL_COMMUNITY_HEALTH_CENTER): Payer: Medicaid Other | Admitting: Family Medicine

## 2019-03-10 ENCOUNTER — Encounter: Payer: Self-pay | Admitting: Family Medicine

## 2019-03-10 VITALS — BP 108/68 | Wt 137.6 lb

## 2019-03-10 DIAGNOSIS — Z30430 Encounter for insertion of intrauterine contraceptive device: Secondary | ICD-10-CM

## 2019-03-10 DIAGNOSIS — Z7251 High risk heterosexual behavior: Secondary | ICD-10-CM

## 2019-03-10 LAB — PREGNANCY, URINE: Preg Test, Ur: NEGATIVE

## 2019-03-10 MED ORDER — PARAGARD INTRAUTERINE COPPER IU IUD
1.0000 | INTRAUTERINE_SYSTEM | Freq: Once | INTRAUTERINE | Status: AC
  Administered 2019-03-10: 1 via INTRAUTERINE

## 2019-03-10 NOTE — Progress Notes (Signed)
Contraception/Family Planning VISIT ENCOUNTER NOTE  Subjective:   Carrie Frazier is a 24 y.o. [id number] female here for reproductive life counseling.  Desires effectiveness from [facility].  Reports she does not want a pregnancy in the next year. Denies abnormal vaginal bleeding, discharge, pelvic pain, problems with intercourse or other gynecologic concerns.   LMP 2/17 Had unprotected sex 2/22 and took Plan B 2/24 Had unprotected sex on 3/3    Gynecologic History Patient's last menstrual period was 02/23/2019 (exact date). Contraception: none  Health Maintenance Due  Topic Date Due  . TETANUS/TDAP  12/29/2013  . INFLUENZA VACCINE  08/07/2018     The following portions of the patient's history were reviewed and updated as appropriate: allergies, current medications, past family history, past medical history, past social history, past surgical history and problem list.  Review of Systems Pertinent items are noted in HPI.   Objective:  BP 108/68   Wt 137 lb 9.6 oz (62.4 kg)   LMP 02/23/2019 (Exact Date)   Breastfeeding Yes Comment: occasionally  BMI 24.37 kg/m  Gen: well appearing, NAD HEENT: no scleral icterus CV: RR Lung: Normal WOB Ext: warm well perfused  PELVIC: Normal appearing external genitalia; normal appearing vaginal mucosa and cervix.  No abnormal discharge noted.  Normal uterine size, no other palpable masses, no uterine or adnexal tenderness.    Her GC/CT screening was found to be up to date and using WHO criteria we can be reasonably certain she is not pregnant or a pregnancy test was obtained which was Urine pregnancy test  today was Negative.  See Flowsheet for IUD check list  IUD Insertion Procedure Note Patient identified, informed consent performed, consent signed.   Discussed risks of irregular bleeding, cramping, infection, malpositioning or misplacement of the IUD outside the uterus which may require further procedure such as laparoscopy. Time out was  performed.    Speculum placed in the vagina.  Cervix visualized.  Cleaned with Betadine x 2.  Grasped anteriorly with a single tooth tenaculum.  Uterus sounded to 8 cm.  IUD placed per manufacturer's recommendations.  Strings trimmed to 3 cm. Tenaculum was removed, good hemostasis noted.  Patient tolerated procedure well.   Patient was given post-procedure instructions- both agency handout and verbally by provider.  She was advised to have backup contraception for one week.  Patient was also asked to check IUD strings periodically or follow up in 4 weeks for IUD check.   Assessment and Plan:   Contraception counseling: Reviewed all forms of birth control options in the tiered based approach. available including abstinence; over the counter/barrier methods; hormonal contraceptive medication including pill, patch, ring, injection,contraceptive implant; hormonal and nonhormonal IUDs; permanent sterilization options including vasectomy and the various tubal sterilization modalities. Risks, benefits, and typical effectiveness rates were reviewed.  Questions were answered.  Written information was also given to the patient to review.  Patient desires IUD-cu, this was prescribed for patient. She will follow up in  23yr for surveillance.  She was told to call with any further questions, or with any concerns about this method of contraception.  Emphasized use of condoms 100% of the time for STI prevention.  Patient was offered ECP. ECP was accepted by the patient. ECP counseling was given and she accepted the CU-IUD for ECP which is  approved for emergency contraceptive use.    1. Unprotected sex - Pregnancy, urine - paragard intrauterine copper IUD 1 each  2. Encounter for IUD insertion - Pregnancy, urine - paragard  intrauterine copper IUD 1 each   Please refer to After Visit Summary for other counseling recommendations.   Return in about 4 weeks (around 04/07/2019) for IUD string check if  needed.  Federico Flake, MD [facility] DEPARTMENT

## 2019-03-10 NOTE — Progress Notes (Signed)
Patient here for IUD insertion. States had IUD for 1 year previously. States she had IUD placed in 2019 and had removed in 2020 due to constant bleeding due to ruptured ovarian cyst, diagnosed at [facility] ED. Has been using Nuva ring, but states not working well for her, has "hard time keeping up with it". Would like to discuss IUD options and wants 5 year IUD. States LMP 02/23/2019. Unprotected sex 02/28/19, took Plan B 03/02/19. Only other unprotected sex 03/09/19.Marland KitchenBurt Knack, RN

## 2019-03-10 NOTE — Progress Notes (Signed)
PT negative. Consents for Paraguard signed and consult done today.Burt Knack, RN

## 2019-03-10 NOTE — Progress Notes (Signed)
Patient still has questions about Mirena vs. Paraguard, would like to discuss with provider.Burt Knack, RN

## 2019-05-12 ENCOUNTER — Ambulatory Visit: Payer: Medicaid Other | Admitting: Physician Assistant

## 2019-05-12 ENCOUNTER — Encounter: Payer: Self-pay | Admitting: Physician Assistant

## 2019-05-12 ENCOUNTER — Other Ambulatory Visit: Payer: Self-pay

## 2019-05-12 DIAGNOSIS — Z113 Encounter for screening for infections with a predominantly sexual mode of transmission: Secondary | ICD-10-CM

## 2019-05-12 NOTE — Progress Notes (Signed)
Here today for STD screening. Declines bloodwork. Jessicaann Overbaugh, RN ? ?

## 2019-05-12 NOTE — Progress Notes (Signed)
S: 24 yo woman here for discussion of STIs. Initially wanted screening/exam, but elected to defer primarily due to new onset heavy menses today. Using Paraguard IUD. No STI sx, including no oral/genital rash/pain/blisters/erythema/itching, no known STI exposure, though heard a rumor that her most recent female partner may have had a prior partner with genital herpes. O: Pleasant well-appearing young woman in NAD. Declines physical exam. A: Concern for potential STI exposure - extensive counseling re: HSV and other STIs, and that in the absence of sx, we cannot test for HSV at [facility]. I offered exam/other STi testing today, but pt declined. I rec she return for comprehensive STI screening with exam and testing after her menses resolve. Pt in agreement with this plan.

## 2019-05-19 ENCOUNTER — Ambulatory Visit: Payer: Medicaid Other

## 2019-07-28 ENCOUNTER — Other Ambulatory Visit: Payer: Self-pay

## 2019-07-28 ENCOUNTER — Encounter: Payer: Self-pay | Admitting: Emergency Medicine

## 2019-07-28 ENCOUNTER — Emergency Department
Admission: EM | Admit: 2019-07-28 | Discharge: 2019-07-28 | Disposition: A | Discharge status: Home / Self Care | Payer: Medicaid Other | Attending: Emergency Medicine | Admitting: Emergency Medicine

## 2019-07-28 DIAGNOSIS — Z87891 Personal history of nicotine dependence: Secondary | ICD-10-CM | POA: Insufficient documentation

## 2019-07-28 DIAGNOSIS — J039 Acute tonsillitis, unspecified: Secondary | ICD-10-CM | POA: Diagnosis not present

## 2019-07-28 DIAGNOSIS — J029 Acute pharyngitis, unspecified: Secondary | ICD-10-CM | POA: Diagnosis present

## 2019-07-28 LAB — CBC WITH DIFFERENTIAL/PLATELET
Abs Immature Granulocytes: 0.06 10*3/uL (ref 0.00–0.07)
Basophils Absolute: 0 10*3/uL (ref 0.0–0.1)
Basophils Relative: 0 %
Eosinophils Absolute: 0 10*3/uL (ref 0.0–0.5)
Eosinophils Relative: 0 %
HCT: 34.5 % — ABNORMAL LOW (ref 36.0–46.0)
Hemoglobin: 12.4 g/dL (ref 12.0–15.0)
Immature Granulocytes: 0 %
Lymphocytes Relative: 7 %
Lymphs Abs: 1.2 10*3/uL (ref 0.7–4.0)
MCH: 26.1 pg (ref 26.0–34.0)
MCHC: 35.9 g/dL (ref 30.0–36.0)
MCV: 72.6 fL — ABNORMAL LOW (ref 80.0–100.0)
Monocytes Absolute: 1.6 10*3/uL — ABNORMAL HIGH (ref 0.1–1.0)
Monocytes Relative: 10 %
Neutro Abs: 13.4 10*3/uL — ABNORMAL HIGH (ref 1.7–7.7)
Neutrophils Relative %: 83 %
Platelets: 192 10*3/uL (ref 150–400)
RBC: 4.75 MIL/uL (ref 3.87–5.11)
RDW: 14.9 % (ref 11.5–15.5)
WBC: 16.3 10*3/uL — ABNORMAL HIGH (ref 4.0–10.5)
nRBC: 0 % (ref 0.0–0.2)

## 2019-07-28 LAB — GROUP A STREP BY PCR: Group A Strep by PCR: NOT DETECTED

## 2019-07-28 LAB — MONONUCLEOSIS SCREEN: Mono Screen: NEGATIVE

## 2019-07-28 MED ORDER — DEXAMETHASONE SODIUM PHOSPHATE 10 MG/ML IJ SOLN
10.0000 mg | Freq: Once | INTRAMUSCULAR | Status: AC
  Administered 2019-07-28: 10 mg via INTRAVENOUS
  Filled 2019-07-28: qty 1

## 2019-07-28 MED ORDER — AMOXICILLIN-POT CLAVULANATE 875-125 MG PO TABS
1.0000 | ORAL_TABLET | Freq: Two times a day (BID) | ORAL | 0 refills | Status: DC
Start: 2019-07-28 — End: 2019-08-01

## 2019-07-28 MED ORDER — SODIUM CHLORIDE 0.9 % IV SOLN
3.0000 g | Freq: Once | INTRAVENOUS | Status: AC
  Administered 2019-07-28: 3 g via INTRAVENOUS
  Filled 2019-07-28: qty 8

## 2019-07-28 MED ORDER — PREDNISONE 10 MG PO TABS
ORAL_TABLET | ORAL | 0 refills | Status: DC
Start: 2019-07-28 — End: 2021-12-03

## 2019-07-28 NOTE — ED Triage Notes (Signed)
C/O sore throat x 1 day  AAOx3.  Skin warm and dry.  Voice clear and strong.  NAD

## 2019-07-28 NOTE — Discharge Instructions (Signed)
Return to the emergency department over the weekend if any severe worsening of your symptoms such as fever, chills or inability to swallow your saliva.  IV antibiotics and a steroid was given to you tonight in the emergency department.  There is also a prescription to be picked up at your pharmacy.  Continue taking the antibiotic for the entire 10 days and begin taking the prednisone tapering down by 1 tablet each day for the next 6 days.  Increase fluids.  You may take Tylenol if additional pain medication as needed.  Eat or drink soft foods such as soups, rice, ice cream, yogurt.   Occasionally an exudative tonsillitis could turn into an abscess which may need to be seen by an ENT specialist.  Dr. Andee Poles is the physician on call and his contact information and address are on your discharge papers.  However if over the weekend it becomes difficult for you to swallow your saliva you should return to the emergency department for reevaluation.

## 2019-07-28 NOTE — ED Provider Notes (Signed)
[facility] Emergency Department Provider Note   ____________________________________________   First MD Initiated Contact with Patient 07/28/19 1730     (approximate)  I have reviewed the triage vital signs and the nursing notes.   HISTORY  Chief Complaint Sore Throat   HPI Carrie Frazier is a 24 y.o. female presents to the ED with complaint of sore throat for 1 day.  Denies any fever or chills.  She states there are white spots on her tonsils.  She is unaware of any exposure to strep.  She does not take any over-the-counter medication today.  She rates her pain as 6 out of 10.         Past Medical History:  Diagnosis Date  . Anemia affecting pregnancy   . Depression   . 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 History of chlamydia     Patient Active Problem List   Diagnosis Date Noted  . Abdominal pain 03/18/2017  . 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 NO PAST SURGERIES      Prior to Admission medications   Medication Sig Start Date End Date Taking? Authorizing Provider  amoxicillin-clavulanate (AUGMENTIN) 875-125 MG tablet Take 1 tablet by mouth 2 (two) times daily for 10 days. 07/28/19 08/07/19  Tommi Rumps, PA-C  levonorgestrel (MIRENA) 20 MCG/24HR IUD 1 each by Intrauterine route once. 08/21/17   Tessa Lerner, NP  predniSONE (DELTASONE) 10 MG tablet Take 6 tablets  today, on day 2 take 5 tablets, day 3 take 4 tablets, day 4 take 3 tablets, day 5 take  2 tablets and 1 tablet the last day 07/28/19   Tommi Rumps, PA-C  Prenatal Vit-Fe Fumarate-FA (MULTIVITAMIN-PRENATAL) 27-0.8 MG TABS tablet Take 1 tablet by mouth daily at 12 noon.    [provider]  etonogestrel-ethinyl estradiol (NUVARING) 0.12-0.015 MG/24HR vaginal ring Insert vaginally and leave in place for 3 consecutive weeks, then remove for 1 week. Patient not taking: Reported on 03/10/2019 01/25/19 07/28/19  Matt Holmes, PA    Allergies Patient has no known allergies.  Family History  Problem Relation Age of Onset  . Diabetes Maternal Grandmother   . Hypertension Maternal Grandmother     Social History Social History   Tobacco Use  . Smoking status: Former Smoker    Types: Cigarettes    Quit date: 11/07/2018    Years since quitting: 0.7  . Smokeless tobacco: Never Used  Substance Use Topics  . Alcohol use: Yes  . Drug use: Not Currently    Types: Marijuana    Comment: stopped in september    Review of Systems Constitutional: No fever/chills Eyes: No visual changes. ENT: Positive sore throat. Cardiovascular: Denies chest pain. Respiratory: Denies shortness of breath. Gastrointestinal: No abdominal pain.  No nausea, no vomiting.   Genitourinary: Negative for dysuria. Musculoskeletal: Negative for muscle skeletal pain. Skin: Negative for rash. Neurological: Negative for headaches, focal weakness or numbness. ____________________________________________   PHYSICAL EXAM:  VITAL SIGNS: ED Triage Vitals  Enc Vitals Group     BP 07/28/19 1718 104/79     Pulse Rate 07/28/19 1718 (!) 110     Resp 07/28/19 1718 16     Temp 07/28/19 1718 99.5 F (37.5 C)     Temp Source 07/28/19 1718 Oral     SpO2 07/28/19 1718 95 %     Weight 07/28/19 1718 137 lb 9.1 oz (62.4 kg)  Height 07/28/19 1718 5\' 3"  (1.6 m)     Head Circumference --      Peak Flow --      Pain Score 07/28/19 1717 6     Pain Loc --      Pain Edu? --      Excl. in GC? --     Constitutional: Alert and oriented. Well appearing and in no acute distress.  Eyes: Conjunctivae are normal. PERRL. EOMI. Head: Atraumatic. Nose: No congestion/rhinnorhea. Mouth/Throat: Mucous membranes are moist.  Oropharynx non-erythematous.  Bilateral tonsils are exudative but uvula is midline.  Tonsils are not touching.  Patient is able to maintain her own secretions at this time and has no difficulty talking. Neck: No stridor.   Positive bilateral lymphadenopathy Cardiovascular: Normal rate, regular rhythm. Grossly normal heart sounds.  Good peripheral circulation. Respiratory: Normal respiratory effort.  No retractions. Lungs CTAB. 07/24/21Musculoskeletal: Moves upper and lower extremities without any difficulty.  Normal gait was noted.   Neurologic:  Normal speech and language. No gross focal neurologic deficits are appreciated. No gait instability. Skin:  Skin is warm, dry and intact. No rash noted. Psychiatric: Mood and affect are normal. Speech and behavior are normal.  ____________________________________________   LABS (all labs ordered are listed, but only abnormal results are displayed)  Labs Reviewed  CBC WITH DIFFERENTIAL/PLATELET - Abnormal; Notable for the following components:      Result Value   WBC 16.3 (*)    HCT 34.5 (*)    MCV 72.6 (*)    Neutro Abs 13.4 (*)    Monocytes Absolute 1.6 (*)    All other components within normal limits  GROUP A STREP BY PCR  MONONUCLEOSIS SCREEN    PROCEDURES  Procedure(s) performed (including Critical Care):  Procedures   ____________________________________________   INITIAL IMPRESSION / ASSESSMENT AND PLAN / ED COURSE  As part of my medical decision making, I reviewed the following data within the electronic MEDICAL RECORD NUMBER Notes from prior ED visits and [place] Controlled Substance Database  24 year old female presents to the ED with complaint of sore throat for 1 day.  Patient denies any fever or chills.  She continues to drink fluids and speak without any difficulty.  Patient is able to maintain her own secretions without any difficulty.  Patient tests for mono and strep were negative.  White count was elevated at 16,000.  She was given Unasyn 3 g IV and Decadron 10 mg IV.  She will continue on Augmentin 875 twice daily for 10 days and a prednisone taper.  She is aware that she should return to the emergency department if any worsening of her symptoms  especially if she is unable to swallow her saliva or become febrile or chills.  Patient will follow up with [facility] if any continued problems.  ____________________________________________   FINAL CLINICAL IMPRESSION(S) / ED DIAGNOSES  Final diagnoses:  Exudative tonsillitis     ED Discharge Orders         Ordered    amoxicillin-clavulanate (AUGMENTIN) 875-125 MG tablet  2 times daily     Discontinue  Reprint     07/28/19 1844    predniSONE (DELTASONE) 10 MG tablet     Discontinue  Reprint     07/28/19 1844           Note:  This document was prepared using Dragon voice recognition software and may include unintentional dictation errors.    07/30/19, PA-C 07/30/19 08/01/19    1601,  Dustin Flock, MD 07/30/19 1456

## 2019-07-28 NOTE — ED Notes (Signed)
Pt given blanket and phone.

## 2019-07-28 NOTE — ED Notes (Signed)
This tech performed Strep PCR swab. Pt tolerated well. Noted swelling of tonsils and upon swabbing R tonsil did seem hardened with whitish area of discoloration.

## 2019-07-28 NOTE — ED Notes (Signed)
Pt unable to sign E-signature due to signature pad malfunction. Pt verbalized understanding of d/c instructions and had no additional questions or concerns for this RN or provider. Pt left with d/c instructions and gathered all personal belongings from room and removed them prior to ED departure.   

## 2019-08-01 ENCOUNTER — Encounter: Payer: Self-pay | Admitting: Emergency Medicine

## 2019-08-01 ENCOUNTER — Other Ambulatory Visit: Payer: Self-pay

## 2019-08-01 ENCOUNTER — Emergency Department
Admission: EM | Admit: 2019-08-01 | Discharge: 2019-08-01 | Disposition: A | Discharge status: Home / Self Care | Payer: Medicaid Other | Attending: Emergency Medicine | Admitting: Emergency Medicine

## 2019-08-01 DIAGNOSIS — Z87891 Personal history of nicotine dependence: Secondary | ICD-10-CM | POA: Insufficient documentation

## 2019-08-01 DIAGNOSIS — L509 Urticaria, unspecified: Secondary | ICD-10-CM | POA: Insufficient documentation

## 2019-08-01 DIAGNOSIS — T7840XA Allergy, unspecified, initial encounter: Secondary | ICD-10-CM | POA: Diagnosis not present

## 2019-08-01 DIAGNOSIS — R21 Rash and other nonspecific skin eruption: Secondary | ICD-10-CM | POA: Diagnosis present

## 2019-08-01 DIAGNOSIS — Z880 Allergy status to penicillin: Secondary | ICD-10-CM | POA: Insufficient documentation

## 2019-08-01 MED ORDER — DIPHENHYDRAMINE HCL 25 MG PO CAPS
50.0000 mg | ORAL_CAPSULE | Freq: Once | ORAL | Status: AC
  Administered 2019-08-01: 50 mg via ORAL
  Filled 2019-08-01: qty 2

## 2019-08-01 MED ORDER — FAMOTIDINE 20 MG PO TABS
20.0000 mg | ORAL_TABLET | Freq: Once | ORAL | Status: AC
  Administered 2019-08-01: 20 mg via ORAL
  Filled 2019-08-01: qty 1

## 2019-08-01 MED ORDER — PREDNISONE 20 MG PO TABS
60.0000 mg | ORAL_TABLET | Freq: Once | ORAL | Status: AC
  Administered 2019-08-01: 60 mg via ORAL
  Filled 2019-08-01: qty 3

## 2019-08-01 MED ORDER — FAMOTIDINE 20 MG PO TABS: 20.0000 mg | ORAL_TABLET | Freq: Two times a day (BID) | ORAL | 0 refills | Status: DC

## 2019-08-01 NOTE — ED Triage Notes (Signed)
pput on med the other day here and now rash and itching--started 24th.

## 2019-08-01 NOTE — ED Notes (Signed)
See triage note  states she was placed on Augmentin about 3 days ago   Developed rash after taking Augmentin and prednisone so she stopped taking both meds. No resp issues noted but does have generalized rash

## 2019-08-01 NOTE — Discharge Instructions (Addendum)
You appear to be having a reaction to the antibiotic (amoxicillin). Take the prescription meds as directed. Continue to take OTC Benadryl as directed. Follow-up with [facility] or return as needed.

## 2019-08-01 NOTE — ED Provider Notes (Signed)
[facility] Emergency Department Provider Note ____________________________________________  Time seen: 1337  I have reviewed the triage vital signs and the nursing notes.  HISTORY  Chief Complaint  Rash  HPI Carrie Frazier is a 24 y.o. female presents her self to the ED for a 2-day complaint of itchy rash to the body and generalized description.  Patient was evaluated here about 4 days prior for an acute tonsillitis.  She was treated empirically with amoxicillin-clavulanic acid  and prednisone.  She reports receiving an IV dose of Unasyn in the ED and taken her first oral dose of antibiotic the following day.  She describes a day later she awoke to itchy legs, chest, back, and arms.  She denies any difficulty breathing, swallowing, or controlling secretions.  She denies any history of drug allergies, has not previously taken amoxicillin or Augmentin.  She presents today having discontinued both prescription medications, and taking Benadryl sporadically.  Past Medical History:  Diagnosis Date  . Anemia affecting pregnancy   . Depression   . 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 History of chlamydia     Patient Active Problem List   Diagnosis Date Noted  . Abdominal pain 03/18/2017  . 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 NO PAST SURGERIES      Prior to Admission medications   Medication Sig Start Date End Date Taking? Authorizing Provider  famotidine (PEPCID) 20 MG tablet Take 1 tablet (20 mg total) by mouth 2 (two) times daily for 7 days. 08/01/19 08/08/19  Madelon Welsch, Charlesetta Ivory, PA-C  levonorgestrel (MIRENA) 20 MCG/24HR IUD 1 each by Intrauterine route once. 08/21/17   Tessa Lerner, NP  predniSONE (DELTASONE) 10 MG tablet Take 6 tablets  today, on day 2 take 5 tablets, day 3 take 4 tablets, day 4 take 3 tablets, day 5 take  2 tablets and 1 tablet the last day 07/28/19   Tommi Rumps, PA-C  Prenatal  Vit-Fe Fumarate-FA (MULTIVITAMIN-PRENATAL) 27-0.8 MG TABS tablet Take 1 tablet by mouth daily at 12 noon.    [provider]  etonogestrel-ethinyl estradiol (NUVARING) 0.12-0.015 MG/24HR vaginal ring Insert vaginally and leave in place for 3 consecutive weeks, then remove for 1 week. Patient not taking: Reported on 03/10/2019 01/25/19 07/28/19  Matt Holmes, PA    Allergies Amoxicillin  Family History  Problem Relation Age of Onset  . Diabetes Maternal Grandmother   . Hypertension Maternal Grandmother     Social History Social History   Tobacco Use  . Smoking status: Former Smoker    Types: Cigarettes    Quit date: 11/07/2018    Years since quitting: 0.7  . Smokeless tobacco: Never Used  Substance Use Topics  . Alcohol use: Yes  . Drug use: Not Currently    Types: Marijuana    Comment: stopped in september    Review of Systems  Constitutional: Negative for fever. Eyes: Negative for visual changes. ENT: Negative for sore throat. Cardiovascular: Negative for chest pain. Respiratory: Negative for shortness of breath. Gastrointestinal: Negative for abdominal pain, vomiting and diarrhea. Genitourinary: Negative for dysuria. Musculoskeletal: Negative for back pain. Skin: Positive for rash. Neurological: Negative for headaches, focal weakness or numbness. ____________________________________________  PHYSICAL EXAM:  VITAL SIGNS: ED Triage Vitals  Enc Vitals Group     BP 08/01/19 1256 110/80     Pulse Rate 08/01/19 1256 88     Resp 08/01/19 1256 16  Temp 08/01/19 1256 98.9 F (37.2 C)     Temp Source 08/01/19 1256 Oral     SpO2 08/01/19 1256 99 %     Weight 08/01/19 1231 137 lb 9.1 oz (62.4 kg)     Height 08/01/19 1231 5\' 3"  (1.6 m)     Head Circumference --      Peak Flow --      Pain Score 08/01/19 1231 0     Pain Loc --      Pain Edu? --      Excl. in GC? --     Constitutional: Alert and oriented. Well appearing and in no distress. Head:  Normocephalic and atraumatic. Eyes: Conjunctivae are normal. PERRL. Normal extraocular movements Ears: Canals clear. TMs intact bilaterally. Nose: No congestion/rhinorrhea/epistaxis. Mouth/Throat: Mucous membranes are moist.  No oral lesions noted.  No oropharyngeal edema, tonsils are flat, without erythema, and without exudate.  No brawny sublingual erythema appreciated. Neck: Supple. No thyromegaly. Hematological/Lymphatic/Immunological: No cervical lymphadenopathy. Cardiovascular: Normal rate, regular rhythm. Normal distal pulses. Respiratory: Normal respiratory effort. No wheezes/rales/rhonchi. Gastrointestinal: Soft and nontender. No distention. Musculoskeletal: Nontender with normal range of motion in all extremities.  Neurologic:  Normal gait without ataxia. Normal speech and language. No gross focal neurologic deficits are appreciated. Skin:  Skin is warm, dry and intact.  Patient with multiple erythematous scaly macules across the extremities and trunk.  No blisters, vesicles, or dehiscence is noted.  Scattered excoriations are appreciated. ____________________________________________  PROCEDURES  Prednisone 60 mg p.o. Diphenhydramine Benadryl 50 mg p.o. Famotidine Pepcid 20 mg p.o. Procedures ____________________________________________  INITIAL IMPRESSION / ASSESSMENT AND PLAN / ED COURSE  Patient with ED evaluation of what appears to be a drug rash related to recent amoxicillin prescription.  Patient is discontinue the antibiotic and continues to have some itchy rash.  She is advised to restart the prednisone tomorrow, and will be discharged with a prescription for famotidine to take twice daily.  She will note amoxicillin/Augmentin as a new drug allergy.  She is stable from the standpoint of her previous infection, no new antibiotics is offered at this time.  [facility] or return to the ED as needed.  Carrie Frazier was evaluated in  Emergency Department on 08/01/2019 for the symptoms described in the history of present illness. She was evaluated in the context of the global COVID-19 pandemic, which necessitated consideration that the patient might be at risk for infection with the SARS-CoV-2 virus that causes COVID-19. Institutional protocols and algorithms that pertain to the evaluation of patients at risk for COVID-19 are in a state of rapid change based on information released by regulatory bodies including the CDC and federal and state organizations. These policies and algorithms were followed during the patient's care in the ED. ____________________________________________  FINAL CLINICAL IMPRESSION(S) / ED DIAGNOSES  Final diagnoses:  Hives  Allergic reaction, initial encounter      08/03/2019, PA-C 08/01/19 1412    08/03/19, MD 08/01/19 [phone]

## 2019-08-31 ENCOUNTER — Ambulatory Visit: Payer: Medicaid Other | Admitting: Family Medicine

## 2019-08-31 ENCOUNTER — Ambulatory Visit: Payer: Medicaid Other

## 2019-08-31 ENCOUNTER — Other Ambulatory Visit: Payer: Self-pay

## 2019-08-31 DIAGNOSIS — Z113 Encounter for screening for infections with a predominantly sexual mode of transmission: Secondary | ICD-10-CM | POA: Diagnosis not present

## 2019-08-31 LAB — WET PREP FOR TRICH, YEAST, CLUE
Trichomonas Exam: NEGATIVE
Yeast Exam: NEGATIVE

## 2019-08-31 NOTE — Progress Notes (Signed)
Presents today for routine STD screen. Denies symptoms. Declines blood work. Notes pain during intercourse with IUD. Sharlyne Pacas, RN

## 2019-08-31 NOTE — Progress Notes (Addendum)
  [facility] Department STI clinic/screening visit  Subjective:  Carrie Frazier is a 24 y.o. female being seen today for an STI screening visit. The patient reports they do not have symptoms.  Patient reports that they do desire a pregnancy in the next year.   They reported they are not interested in discussing contraception today.  Patient's last menstrual period was 08/15/2019 (within days).   Patient has the following medical conditions:   Patient Active Problem List   Diagnosis Date Noted  . Abdominal pain 03/18/2017  . 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 SEXUALLY TRANSMITTED DISEASE    screening     HPI  Patient reports having pain with intercourse, Patient has IUD, denies any other symptoms.    Last HIV test per patient/review of record was 2017 Patient reports last pap was 2019  See flowsheet for further details and programmatic requirements.    The following portions of the patient's history were reviewed and updated as appropriate: allergies, current medications, past medical history, past social history, past surgical history and problem list.  Objective:  There were no vitals filed for this visit.  Physical Exam Constitutional:      Appearance: Normal appearance.  HENT:     Head: Normocephalic.     Mouth/Throat:     Mouth: Mucous membranes are moist.     Pharynx: Oropharynx is clear. No oropharyngeal exudate or posterior oropharyngeal erythema.  Abdominal:     General: Abdomen is flat.     Palpations: Abdomen is soft. There is no mass.     Tenderness: There is no abdominal tenderness. There is no guarding.  Genitourinary:    Comments: External genitalia without, lice, nits, erythema, edema , lesions or inguinal adenopathy. Vagina with normal mucosa and discharge and pH equals 4.  Cervix without visual lesions, uterus firm, mobile, non-tender, no masses, CMT adnexal fullness.  Anterior cervical tenderness  with manipulation   Musculoskeletal:     Cervical back: Normal range of motion.  Lymphadenopathy:     Cervical: No cervical adenopathy.  Skin:    General: Skin is warm and dry.     Findings: No bruising, erythema, lesion or rash.  Neurological:     Mental Status: She is alert.  Psychiatric:        Mood and Affect: Mood normal.        Behavior: Behavior normal.      Assessment and Plan:  Carrie Frazier is a 24 y.o. female presenting to the [facility] Department for STI screening  1. Screening for venereal disease - Chlamydia/Gonorrhea [facility] - WET PREP FOR TRICH, YEAST, CLUE  Awaiting results of Chlamydia/ gonorrhea test.  Per wet prep no treatment needed today.  Patient declined blood work.  Patient reported pain with sex but no pain with specimen collection or with pelvic.  Consulted ESherren Kerns, CNM.  Pelvic done by E. Sciora and pain felt with anterior manipulation, E. Scoria, recommended  Patient to change positions with sex.      No follow-ups on file.  No future appointments.  Wendi Snipes, RN

## 2019-09-06 ENCOUNTER — Other Ambulatory Visit: Payer: Self-pay

## 2019-09-06 DIAGNOSIS — R21 Rash and other nonspecific skin eruption: Secondary | ICD-10-CM | POA: Insufficient documentation

## 2019-09-06 DIAGNOSIS — Z5321 Procedure and treatment not carried out due to patient leaving prior to being seen by health care provider: Secondary | ICD-10-CM | POA: Diagnosis not present

## 2019-09-06 NOTE — ED Triage Notes (Signed)
Pt states for weeks has been having intermittent break out of rash that itches on body. Pt states she has not taken benadryl. Pt states sometimes at night she wakes up with itching in her throat and rash. No resp disttress noted, fine red raised rash noted to legs and arms.

## 2019-09-07 ENCOUNTER — Emergency Department
Admission: EM | Admit: 2019-09-07 | Discharge: 2019-09-07 | Disposition: A | Discharge status: Home / Self Care | Payer: Medicaid Other | Attending: Emergency Medicine | Admitting: Emergency Medicine

## 2019-09-07 NOTE — ED Notes (Signed)
No answer when called from lobby & outside 

## 2019-09-13 NOTE — Progress Notes (Signed)
Consulted by Elveria Rising, RN to examine pt with c/o pain during sex onset since she has been sexually active.  Speculum exam with vagina pink, white creamy leukorrhea, cx with healed laceration 10:00 position,os wnl.  Bimanual with no cx motion tenderness, uterus NSSC, ovaries normal with no tenderness.  I was able to mimic pain during coitus with firm pressure anterior to cx only.  Rest of bimanual exam wnl.  Counseled to change positions with sex in order to be able to control depth and position, as well as pt comfort

## 2019-09-16 ENCOUNTER — Encounter: Payer: Self-pay | Admitting: Family Medicine

## 2019-10-24 ENCOUNTER — Emergency Department
Admission: EM | Admit: 2019-10-24 | Discharge: 2019-10-24 | Disposition: A | Discharge status: Home / Self Care | Payer: Medicaid Other | Attending: Emergency Medicine | Admitting: Emergency Medicine

## 2019-10-24 ENCOUNTER — Other Ambulatory Visit: Payer: Self-pay

## 2019-10-24 ENCOUNTER — Encounter: Payer: Self-pay | Admitting: Emergency Medicine

## 2019-10-24 DIAGNOSIS — Z5321 Procedure and treatment not carried out due to patient leaving prior to being seen by health care provider: Secondary | ICD-10-CM | POA: Insufficient documentation

## 2019-10-24 DIAGNOSIS — T7840XA Allergy, unspecified, initial encounter: Secondary | ICD-10-CM | POA: Insufficient documentation

## 2019-10-24 NOTE — ED Triage Notes (Signed)
Pt reports this am started with bumps on the bottom of her feet. Pt unsure of what is causing it

## 2019-11-24 ENCOUNTER — Emergency Department: Payer: Medicaid Other

## 2019-11-24 ENCOUNTER — Other Ambulatory Visit: Payer: Self-pay

## 2019-11-24 ENCOUNTER — Emergency Department
Admission: EM | Admit: 2019-11-24 | Discharge: 2019-11-24 | Disposition: A | Discharge status: Home / Self Care | Payer: Medicaid Other | Attending: Emergency Medicine | Admitting: Emergency Medicine

## 2019-11-24 DIAGNOSIS — T07XXXA Unspecified multiple injuries, initial encounter: Secondary | ICD-10-CM

## 2019-11-24 DIAGNOSIS — S63502A Unspecified sprain of left wrist, initial encounter: Secondary | ICD-10-CM | POA: Diagnosis not present

## 2019-11-24 DIAGNOSIS — R079 Chest pain, unspecified: Secondary | ICD-10-CM | POA: Insufficient documentation

## 2019-11-24 DIAGNOSIS — M7918 Myalgia, other site: Secondary | ICD-10-CM

## 2019-11-24 DIAGNOSIS — F17210 Nicotine dependence, cigarettes, uncomplicated: Secondary | ICD-10-CM | POA: Diagnosis not present

## 2019-11-24 DIAGNOSIS — S8002XA Contusion of left knee, initial encounter: Secondary | ICD-10-CM | POA: Diagnosis not present

## 2019-11-24 DIAGNOSIS — S62524A Nondisplaced fracture of distal phalanx of right thumb, initial encounter for closed fracture: Secondary | ICD-10-CM | POA: Diagnosis not present

## 2019-11-24 DIAGNOSIS — S6991XA Unspecified injury of right wrist, hand and finger(s), initial encounter: Secondary | ICD-10-CM | POA: Diagnosis present

## 2019-11-24 DIAGNOSIS — R52 Pain, unspecified: Secondary | ICD-10-CM

## 2019-11-24 MED ORDER — OXYCODONE-ACETAMINOPHEN 5-325 MG PO TABS
1.0000 | ORAL_TABLET | Freq: Once | ORAL | Status: AC
  Administered 2019-11-24: 1 via ORAL
  Filled 2019-11-24: qty 1

## 2019-11-24 MED ORDER — IBUPROFEN 600 MG PO TABS
600.0000 mg | ORAL_TABLET | Freq: Once | ORAL | Status: AC
  Administered 2019-11-24: 600 mg via ORAL
  Filled 2019-11-24: qty 1
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
## 2019-11-24 NOTE — Discharge Instructions (Signed)
As we discussed, your work-up was generally reassuring.  You have a fracture of your right thumb so it is important that you call the number for Dr. Rosita Kea and schedule an appointment with him or one of his orthopedics colleagues at the next available opportunity.  They will be able to give you further recommendations for the long-term treatment of this injury.  You can use the Velcro wrist splint provided for your left wrist but also be sure and let him know how your wrist and hand are feeling when you follow-up in clinic.  Limit your activity with your left hand based on how it is feeling and the level of pain.  Use over-the-counter ibuprofen and/or Tylenol as needed for pain control.  You can also use ice packs on any of your injuries to help with the pain.    Return to the emergency department if you develop new or worsening symptoms that concern you.
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
## 2020-02-15 ENCOUNTER — Ambulatory Visit: Payer: Medicaid Other

## 2020-02-17 ENCOUNTER — Ambulatory Visit: Payer: Medicaid Other

## 2020-03-09 ENCOUNTER — Ambulatory Visit (LOCAL_COMMUNITY_HEALTH_CENTER): Payer: Medicaid Other | Admitting: Family Medicine

## 2020-03-09 ENCOUNTER — Other Ambulatory Visit: Payer: Self-pay

## 2020-03-09 ENCOUNTER — Ambulatory Visit: Payer: Medicaid Other

## 2020-03-09 DIAGNOSIS — Z5321 Procedure and treatment not carried out due to patient leaving prior to being seen by health care provider: Secondary | ICD-10-CM

## 2020-03-09 NOTE — Progress Notes (Signed)
Patient rescheduled and left, did not see provider today.    Wendi Snipes, FNP

## 2020-03-16 ENCOUNTER — Ambulatory Visit: Payer: Medicaid Other

## 2020-03-29 ENCOUNTER — Ambulatory Visit: Payer: Medicaid Other

## 2020-04-24 ENCOUNTER — Ambulatory Visit: Payer: Medicaid Other

## 2020-05-07 ENCOUNTER — Ambulatory Visit (LOCAL_COMMUNITY_HEALTH_CENTER): Payer: Medicaid Other | Admitting: Advanced Practice Midwife

## 2020-05-07 ENCOUNTER — Other Ambulatory Visit: Payer: Self-pay

## 2020-05-07 VITALS — BP 111/72 | HR 83 | Temp 97.5°F | Ht 63.7 in | Wt 140.0 lb

## 2020-05-07 DIAGNOSIS — B9689 Other specified bacterial agents as the cause of diseases classified elsewhere: Secondary | ICD-10-CM

## 2020-05-07 DIAGNOSIS — F17200 Nicotine dependence, unspecified, uncomplicated: Secondary | ICD-10-CM

## 2020-05-07 DIAGNOSIS — Z3009 Encounter for other general counseling and advice on contraception: Secondary | ICD-10-CM | POA: Diagnosis not present

## 2020-05-07 DIAGNOSIS — Z3049 Encounter for surveillance of other contraceptives: Secondary | ICD-10-CM

## 2020-05-07 DIAGNOSIS — Z113 Encounter for screening for infections with a predominantly sexual mode of transmission: Secondary | ICD-10-CM

## 2020-05-07 DIAGNOSIS — N760 Acute vaginitis: Secondary | ICD-10-CM

## 2020-05-07 LAB — WET PREP FOR TRICH, YEAST, CLUE
Trichomonas Exam: NEGATIVE
Yeast Exam: NEGATIVE

## 2020-05-07 MED ORDER — METRONIDAZOLE 500 MG PO TABS: 500.0000 mg | ORAL_TABLET | Freq: Two times a day (BID) | ORAL | 0 refills | Status: AC

## 2020-05-07 NOTE — Progress Notes (Signed)
Patient treated for BV per SO. Condoms given.Burt Knack, RN

## 2020-05-07 NOTE — Progress Notes (Signed)
Client counseled that physical due and encouraged to schedule today. Presents to clinic this am for IUD check and STI only. Denies problems with IUD and states card she was given when put in last year says to have checked each year. Jossie Ng, RN

## 2020-05-07 NOTE — Progress Notes (Signed)
Contraception/Family Planning VISIT ENCOUNTER NOTE  Subjective:   Carrie Frazier is a 25 y.o. [id number](2,2) smoker female here for reproductive life counseling.  Desires to continue with her Paraguard for [facility] [place].  Reports she does not want a pregnancy in the next year. Denies abnormal vaginal bleeding, discharge, pelvic pain, problems with intercourse or other gynecologic concerns. Smoking 3-6 cpd and vapes occasionally, ex cigar smoker. Last MJ 3 mo ago. Last ETOH 05/05/20 (1 glass wine)qo month. LMP 04/30/20. Last sex 04/25/20. Paraguard inserted 03/2019. Last pap 08/20/17 neg   Gynecologic History Patient's last menstrual period was 04/30/2020 (within days). Contraception: IUD  Health Maintenance Due  Topic Date Due  . Hepatitis C Screening  Never done  . COVID-19 Vaccine (1) Never done  . HPV VACCINES (1 - 2-dose series) Never done  . TETANUS/TDAP  Never done     The following portions of the patient's history were reviewed and updated as appropriate: allergies, current medications, past family history, past medical history, past social history, past surgical history and problem list.  Review of Systems Pertinent items are noted in HPI.   Objective:  BP 111/72   Pulse 83   Temp (!) 97.5 F (36.4 C)   Ht 5' 3.7" (1.618 m)   Wt 140 lb (63.5 kg)   LMP 04/30/2020 (Within Days)   Breastfeeding Yes   BMI 24.26 kg/m  Gen: well appearing, NAD HEENT: no scleral icterus CV: RR Lung: Normal WOB Ext: warm well perfused  PELVIC: Normal appearing external genitalia; normal appearing vaginal mucosa and cervix.  Grey malodorous leukorrhea, ph>4.5 . Normal uterine size, no other palpable masses, no uterine or adnexal tenderness.   Assessment and Plan:   Contraception counseling: Reviewed all forms of birth control options in the tiered based approach. available including abstinence; over the counter/barrier methods; hormonal contraceptive medication including pill, patch, ring,  injection,contraceptive implant, ECP; hormonal and nonhormonal IUDs; permanent sterilization options including vasectomy and the various tubal sterilization modalities. Risks, benefits, and typical effectiveness rates were reviewed.  Questions were answered.  Written information was also given to the patient to review.  Patient desires continuation of Paraguard, this was prescribed for patient. She will follow up in  asap for yearly physical due 08/2019 for surveillance.  She was told to call with any further questions, or with any concerns about this method of contraception.  Emphasized use of condoms 100% of the time for STI prevention.  Patient was offered ECP. ECP was not accepted by the patient. ECP counseling was not given - see RN documentation  1. Screening examination for venereal disease Treat wet mount per standing orders Immunization nurse consult Pt needs to schedule yearly physical (due 08/2019) - WET PREP FOR TRICH, YEAST, CLUE - Chlamydia/Gonorrhea [place] - HIV [place] [facility] - Syphilis Serology, [place] [facility]  2. Encounter for surveillance of other contraceptive Pt desires IUD check--strings in place    Please refer to After Visit Summary for other counseling recommendations.   No follow-ups on file.  Alberteen Spindle, CNM [facility] DEPARTMENT

## 2020-05-14 LAB — HM HIV SCREENING LAB: HM HIV Screening: NEGATIVE

## 2021-01-03 ENCOUNTER — Other Ambulatory Visit: Payer: Self-pay

## 2021-01-03 ENCOUNTER — Ambulatory Visit: Payer: Medicaid Other | Admitting: Advanced Practice Midwife

## 2021-01-03 ENCOUNTER — Encounter: Payer: Self-pay | Admitting: Advanced Practice Midwife

## 2021-01-03 DIAGNOSIS — A599 Trichomoniasis, unspecified: Secondary | ICD-10-CM

## 2021-01-03 DIAGNOSIS — Z113 Encounter for screening for infections with a predominantly sexual mode of transmission: Secondary | ICD-10-CM

## 2021-01-03 HISTORY — DX: Trichomoniasis, unspecified: A59.9

## 2021-01-03 LAB — WET PREP FOR TRICH, YEAST, CLUE
Trichomonas Exam: POSITIVE — AB
Yeast Exam: NEGATIVE

## 2021-01-03 MED ORDER — METRONIDAZOLE 500 MG PO TABS: 500.0000 mg | ORAL_TABLET | Freq: Two times a day (BID) | ORAL | 0 refills | Status: AC

## 2021-01-03 NOTE — Addendum Note (Signed)
Addended by: Berdie Ogren on: 01/03/2021 05:34 PM   Modules accepted: Orders

## 2021-01-03 NOTE — Progress Notes (Signed)
Pt here for STD screening.  Wet mount results reviewed and medication dispensed per Provider orders.  Pt declined condoms. Javad Salva M Batoul Limes, RN  

## 2021-01-03 NOTE — Progress Notes (Signed)
[facility] Department  STI clinic/screening visit [address] [place] [address] [phone]  Subjective:  Carrie Frazier is a 25 y.o. SBF G2P2 smoker female being seen today for an STI screening visit. The patient reports they do have symptoms.  Patient reports that they do not desire a pregnancy in the next year.   They reported they are not interested in discussing contraception today.    Patient's last menstrual period was 12/09/2020 (approximate).   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 Last sex 12/29/20 without condom; with current partner x 2 years; 1 sex partner in last 3 mo. LMP 12/09/20. Paraguard inserted 03/2019. Smoking 2-3 cpd. Last vaped 08/2020. Last MJ 08/2020. Last ETOH 12/29/20 (1 Margarita) 1x/mo.   Last HIV test per patient/review of record was 05/14/20 Patient reports last pap was 08/20/17 neg  Screening for MPX risk: Does the patient have an unexplained rash? No Is the patient MSM? No Does the patient endorse multiple sex partners or anonymous sex partners? No Did the patient have close or sexual contact with a person diagnosed with MPX? No Has the patient traveled outside the [place] where MPX is endemic? No Is there a high clinical suspicion for MPX-- evidenced by one of the following No  -Unlikely to be chickenpox  -Lymphadenopathy  -Rash that present in same phase of evolution on any given body part See flowsheet for further details and programmatic requirements.    The following portions of the patient's history were reviewed and updated as appropriate: allergies, current medications, past medical history, past social history,  past surgical history and problem list.  Objective:  There were no vitals filed for this visit.  Physical Exam Vitals and nursing note reviewed.  Constitutional:      Appearance: Normal appearance. She is normal weight.  HENT:     Head: Normocephalic and atraumatic.     Mouth/Throat:     Mouth: Mucous membranes are moist.     Pharynx: Oropharynx is clear. No oropharyngeal exudate or posterior oropharyngeal erythema.  Eyes:     Conjunctiva/sclera: Conjunctivae normal.  Pulmonary:     Effort: Pulmonary effort is normal.  Abdominal:     Palpations: Abdomen is soft. There is no mass.     Tenderness: There is no abdominal tenderness. There is no rebound.     Comments: Soft without masses or tenderness, poor tone  Genitourinary:    General: Normal vulva.     Exam position: Lithotomy position.     Pubic Area: No rash or pubic lice.      Labia:        Right: No rash or lesion.        Left: No rash or lesion.      Vagina: Vaginal discharge (large amt grey thin leukorrhea, ph>4.5) and tenderness (tender to speculum exam) present. No erythema, bleeding or lesions.     Cervix: Normal.     Uterus: Normal.      Adnexa: Right adnexa normal and left adnexa normal.     Rectum: Normal.  Lymphadenopathy:     Head:     Right side  of head: No preauricular or posterior auricular adenopathy.     Left side of head: No preauricular or posterior auricular adenopathy.     Cervical: No cervical adenopathy.     Right cervical: No superficial, deep or posterior cervical adenopathy.    Left cervical: No superficial, deep or posterior cervical adenopathy.     Upper Body:     Right upper body: No supraclavicular or axillary adenopathy.     Left upper body: No supraclavicular or axillary adenopathy.     Lower Body: No right inguinal adenopathy. No left inguinal adenopathy.  Skin:    General: Skin is warm and dry.     Findings: No rash.  Neurological:     Mental Status: She is alert and oriented to  person, place, and time.     Assessment and Plan:  Carrie Frazier is a 25 y.o. female presenting to the [facility] Department for STI screening  1. Screening examination for venereal disease Please give pt 1800# to quit smoking Treat wet mount per standing orders Immunization nurse consult - WET PREP FOR TRICH, YEAST, CLUE - Chlamydia/Gonorrhea [place]     No follow-ups on file.  No future appointments.  Alberteen Spindle, CNM
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
## 2021-02-04 ENCOUNTER — Ambulatory Visit: Payer: Medicaid Other

## 2021-02-24 ENCOUNTER — Emergency Department
Admission: EM | Admit: 2021-02-24 | Discharge: 2021-02-24 | Disposition: A | Discharge status: Home / Self Care | Payer: Medicaid Other | Attending: Emergency Medicine | Admitting: Emergency Medicine

## 2021-02-24 ENCOUNTER — Other Ambulatory Visit: Payer: Self-pay

## 2021-02-24 ENCOUNTER — Encounter: Payer: Self-pay | Admitting: Emergency Medicine

## 2021-02-24 DIAGNOSIS — R110 Nausea: Secondary | ICD-10-CM

## 2021-02-24 DIAGNOSIS — M79673 Pain in unspecified foot: Secondary | ICD-10-CM | POA: Insufficient documentation

## 2021-02-24 DIAGNOSIS — F419 Anxiety disorder, unspecified: Secondary | ICD-10-CM | POA: Diagnosis not present

## 2021-02-24 DIAGNOSIS — E876 Hypokalemia: Secondary | ICD-10-CM

## 2021-02-24 DIAGNOSIS — M79641 Pain in right hand: Secondary | ICD-10-CM | POA: Diagnosis not present

## 2021-02-24 DIAGNOSIS — N390 Urinary tract infection, site not specified: Secondary | ICD-10-CM | POA: Insufficient documentation

## 2021-02-24 DIAGNOSIS — M79642 Pain in left hand: Secondary | ICD-10-CM | POA: Insufficient documentation

## 2021-02-24 DIAGNOSIS — Z79899 Other long term (current) drug therapy: Secondary | ICD-10-CM | POA: Diagnosis not present

## 2021-02-24 DIAGNOSIS — R064 Hyperventilation: Secondary | ICD-10-CM | POA: Diagnosis present

## 2021-02-24 LAB — URINALYSIS, COMPLETE (UACMP) WITH MICROSCOPIC
Bilirubin Urine: NEGATIVE
Glucose, UA: NEGATIVE mg/dL
Hgb urine dipstick: NEGATIVE
Ketones, ur: 20 mg/dL — AB
Nitrite: NEGATIVE
Protein, ur: 30 mg/dL — AB
Specific Gravity, Urine: 1.021 (ref 1.005–1.030)
WBC, UA: >50 WBC/hpf — ABNORMAL HIGH (ref 0–5)
pH: 6 (ref 5.0–8.0)

## 2021-02-24 LAB — CBC WITH DIFFERENTIAL/PLATELET
Abs Immature Granulocytes: 0.03 10*3/uL (ref 0.00–0.07)
Basophils Absolute: 0 10*3/uL (ref 0.0–0.1)
Basophils Relative: 0 %
Eosinophils Absolute: 0 10*3/uL (ref 0.0–0.5)
Eosinophils Relative: 0 %
HCT: 40.4 % (ref 36.0–46.0)
Hemoglobin: 14 g/dL (ref 12.0–15.0)
Immature Granulocytes: 0 %
Lymphocytes Relative: 10 %
Lymphs Abs: 1.1 10*3/uL (ref 0.7–4.0)
MCH: 25.4 pg — ABNORMAL LOW (ref 26.0–34.0)
MCHC: 34.7 g/dL (ref 30.0–36.0)
MCV: 73.2 fL — ABNORMAL LOW (ref 80.0–100.0)
Monocytes Absolute: 0.8 10*3/uL (ref 0.1–1.0)
Monocytes Relative: 8 %
Neutro Abs: 8.7 10*3/uL — ABNORMAL HIGH (ref 1.7–7.7)
Neutrophils Relative %: 82 %
Platelets: 381 10*3/uL (ref 150–400)
RBC: 5.52 MIL/uL — ABNORMAL HIGH (ref 3.87–5.11)
RDW: 14.8 % (ref 11.5–15.5)
WBC: 10.7 10*3/uL — ABNORMAL HIGH (ref 4.0–10.5)
nRBC: 0 % (ref 0.0–0.2)

## 2021-02-24 LAB — URINE DRUG SCREEN, QUALITATIVE (ARMC ONLY)
Amphetamines, Ur Screen: POSITIVE — AB
Barbiturates, Ur Screen: NOT DETECTED
Benzodiazepine, Ur Scrn: NOT DETECTED
Cannabinoid 50 Ng, Ur ~~LOC~~: POSITIVE — AB
Cocaine Metabolite,Ur ~~LOC~~: NOT DETECTED
MDMA (Ecstasy)Ur Screen: NOT DETECTED
Methadone Scn, Ur: NOT DETECTED
Opiate, Ur Screen: NOT DETECTED
Phencyclidine (PCP) Ur S: NOT DETECTED
Tricyclic, Ur Screen: NOT DETECTED

## 2021-02-24 LAB — BASIC METABOLIC PANEL
Anion gap: 15 (ref 5–15)
BUN: 11 mg/dL (ref 6–20)
CO2: 20 mmol/L — ABNORMAL LOW (ref 22–32)
Calcium: 9.9 mg/dL (ref 8.9–10.3)
Chloride: 102 mmol/L (ref 98–111)
Creatinine, Ser: 0.97 mg/dL (ref 0.44–1.00)
GFR, Estimated: >60 mL/min (ref >60)
Glucose, Bld: 141 mg/dL — ABNORMAL HIGH (ref 70–99)
Potassium: 2.8 mmol/L — ABNORMAL LOW (ref 3.5–5.1)
Sodium: 137 mmol/L (ref 135–145)

## 2021-02-24 LAB — POC URINE PREG, ED: Preg Test, Ur: NEGATIVE

## 2021-02-24 LAB — ETHANOL: Alcohol, Ethyl (B): <10 mg/dL (ref <10)

## 2021-02-24 LAB — TROPONIN I (HIGH SENSITIVITY): Troponin I (High Sensitivity): 4 ng/L (ref <18)

## 2021-02-24 MED ORDER — LORAZEPAM 2 MG/ML IJ SOLN
1.0000 mg | Freq: Once | INTRAMUSCULAR | Status: AC
  Administered 2021-02-24: 1 mg via INTRAVENOUS
  Filled 2021-02-24: qty 1

## 2021-02-24 MED ORDER — POTASSIUM CHLORIDE CRYS ER 20 MEQ PO TBCR
40.0000 meq | EXTENDED_RELEASE_TABLET | Freq: Once | ORAL | Status: AC
  Administered 2021-02-24: 40 meq via ORAL
  Filled 2021-02-24: qty 2

## 2021-02-24 MED ORDER — CEPHALEXIN 500 MG PO CAPS
500.0000 mg | ORAL_CAPSULE | Freq: Once | ORAL | Status: AC
  Administered 2021-02-24: 500 mg via ORAL
  Filled 2021-02-24: qty 1

## 2021-02-24 MED ORDER — CEPHALEXIN 500 MG PO CAPS: 500.0000 mg | ORAL_CAPSULE | Freq: Three times a day (TID) | ORAL | 0 refills | Status: DC

## 2021-02-24 MED ORDER — SODIUM CHLORIDE 0.9 % IV BOLUS
1000.0000 mL | Freq: Once | INTRAVENOUS | Status: AC
  Administered 2021-02-24: 1000 mL via INTRAVENOUS

## 2021-02-24 MED ORDER — POTASSIUM CHLORIDE CRYS ER 20 MEQ PO TBCR: 20.0000 meq | EXTENDED_RELEASE_TABLET | Freq: Two times a day (BID) | ORAL | 0 refills | Status: DC

## 2021-02-24 NOTE — ED Triage Notes (Signed)
Pt to ED via POV with c/o hand and feet pain, no known injury. Pt is more worried about who her Emergency contacts are in the computer than reason for being here.
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
## 2021-02-24 NOTE — ED Notes (Signed)
MD at the bedside  

## 2021-02-24 NOTE — ED Notes (Signed)
Patient provided ice per MD ok

## 2021-02-24 NOTE — Discharge Instructions (Signed)
Please take your potassium as prescribed for the next 7 days.  Please take your antibiotic as prescribed for the next 7 days.  Please follow-up with your doctor in 1 week for recheck of your lab work including your potassium levels.  Return to the emergency department for any symptoms personally concerning to yourself.  Please drink plenty of fluids and obtain plenty of rest.

## 2021-02-25 LAB — CHLAMYDIA/NGC RT PCR (ARMC ONLY)
Chlamydia Tr: NOT DETECTED
N gonorrhoeae: NOT DETECTED

## 2021-04-22 ENCOUNTER — Telehealth: Payer: Self-pay

## 2021-04-22 ENCOUNTER — Ambulatory Visit: Payer: Medicaid Other

## 2021-04-22 NOTE — Telephone Encounter (Signed)
Call to client to reschedule PE due to staffing (will remove IUD at appt today). Per client, she desires one appt for PE and IUD removal. Appt scheduled for tomorrow am and counseled to arrive by 9:10 am. Jossie Ng, RN ? ?

## 2021-04-23 ENCOUNTER — Ambulatory Visit: Payer: Medicaid Other

## 2021-06-20 ENCOUNTER — Encounter: Payer: Self-pay | Admitting: Nurse Practitioner

## 2021-06-20 ENCOUNTER — Ambulatory Visit: Payer: Medicaid Other | Admitting: Nurse Practitioner

## 2021-06-20 DIAGNOSIS — F419 Anxiety disorder, unspecified: Secondary | ICD-10-CM

## 2021-06-20 DIAGNOSIS — B9689 Other specified bacterial agents as the cause of diseases classified elsewhere: Secondary | ICD-10-CM

## 2021-06-20 DIAGNOSIS — Z113 Encounter for screening for infections with a predominantly sexual mode of transmission: Secondary | ICD-10-CM

## 2021-06-20 LAB — WET PREP FOR TRICH, YEAST, CLUE
Trichomonas Exam: NEGATIVE
Yeast Exam: NEGATIVE

## 2021-06-20 MED ORDER — METRONIDAZOLE 500 MG PO TABS: 500.0000 mg | ORAL_TABLET | Freq: Two times a day (BID) | ORAL | 0 refills | Status: AC

## 2021-06-20 NOTE — Progress Notes (Signed)
[facility] Department  STI clinic/screening visit [address] [place] [address] [phone]  Subjective:  Carrie Frazier is a 26 y.o. female being seen today for an STI screening visit. The patient reports they do have symptoms.  Patient reports that they do desire a pregnancy in the next year.   They reported they are not interested in discussing contraception today.    Patient's last menstrual period was 06/06/2021 (exact date).   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  Patient reports discharge for 2 weeks.  Patient also desires to have IUD removed.    Last HIV test per patient/review of record was 05/14/2020. Patient reports last pap was 08/20/2017.   Screening for MPX risk: Does the patient have an unexplained rash? No Is the patient MSM? No Does the patient endorse multiple sex partners or anonymous sex partners? No Did the patient have close or sexual contact with a person diagnosed with MPX? No Has the patient traveled outside the [place] where MPX is endemic? No Is there a high clinical suspicion for MPX-- evidenced by one of the following No  -Unlikely to be chickenpox  -Lymphadenopathy  -Rash that present in same phase of evolution on any given body part See flowsheet for further details and programmatic requirements.   Immunization history:  Immunization History  Administered Date(s) Administered   Hepatitis A 06/13/2010   Hepatitis B 06/05/1994, 01/17/1995, 08/07/1995   Tdap 06/10/2007     The following portions of the patient's history were reviewed and updated as appropriate: allergies,  current medications, past medical history, past social history, past surgical history and problem list.  Objective:  There were no vitals filed for this visit.  Physical Exam Constitutional:      Appearance: Normal appearance.  HENT:     Head: Normocephalic. No abrasion, masses or laceration. Hair is normal.     Mouth/Throat:     Mouth: No oral lesions.     Pharynx: No oropharyngeal exudate or posterior oropharyngeal erythema.     Tonsils: No tonsillar exudate or tonsillar abscesses.  Eyes:     General: Lids are normal.        Right eye: No discharge.        Left eye: No discharge.     Conjunctiva/sclera: Conjunctivae normal.     Right eye: No exudate.    Left eye: No exudate. Abdominal:     General: Abdomen is flat.     Palpations: Abdomen is soft.     Tenderness: There is no abdominal tenderness. There is no rebound.  Genitourinary:    Pubic Area: No rash or pubic lice.      Labia:        Right: No rash, tenderness, lesion or injury.        Left: No rash, tenderness, lesion or injury.      Vagina: Normal. No vaginal discharge, erythema or lesions.     Cervix: No cervical motion tenderness, discharge, lesion or erythema.     Uterus: Not enlarged and not tender.      Rectum: Normal.  Comments: Amount Discharge: small  Odor: Yes pH: greater than 4.5 Adheres to vaginal wall: No Color: color of discharge matches the Carrie Frazier swab  Lymphadenopathy:     Cervical: No cervical adenopathy.     Right cervical: No superficial, deep or posterior cervical adenopathy.    Left cervical: No superficial, deep or posterior cervical adenopathy.     Upper Body:     Right upper body: No supraclavicular, axillary or epitrochlear adenopathy.     Left upper body: No supraclavicular, axillary or epitrochlear adenopathy.     Lower Body: No right inguinal adenopathy. No left inguinal adenopathy.  Skin:    General: Skin is warm and dry.     Findings: No lesion or rash.  Neurological:      Mental Status: She is alert and oriented to person, place, and time.  Psychiatric:        Attention and Perception: Attention normal.        Mood and Affect: Mood normal.      Assessment and Plan:  Carrie Frazier is a 26 y.o. female presenting to the [facility] Department for STI screening  1. Screening examination for venereal disease -26 year old female in clinic today for STD screening.  -Patient desires to have IUD removed.  -Patient accepted all screenings including oral, vaginal CT/GC, wet prep, and bloodwork for HIV/RPR.  Patient meets criteria for HepB screening? Yes. Ordered? No - declined Patient meets criteria for HepC screening? Yes. Ordered? No - declined  Treat wet prep per standing order Discussed time line for [facility] results and that patient will be called with positive results and encouraged patient to call if she had not heard in 2 weeks.  Counseled to return or seek care for continued or worsening symptoms Recommended condom use with all sex  Patient is currently using Hormonal Contraception: Injection, Rings and Patches to prevent pregnancy.    - WET PREP FOR TRICH, YEAST, CLUE - Chlamydia/Gonorrhea [facility] - Chlamydia/Gonorrhea [facility]   2. Bacterial vaginosis -Wet mount reviewed, treat patient for BV.   - metroNIDAZOLE (FLAGYL) 500 MG tablet; Take 1 tablet (500 mg total) by mouth 2 (two) times daily for 7 days.  Dispense: 14 tablet; Refill: 0  3. Anxiety -History of anxiety.  Referred for Behavioral Health.   - Ambulatory referral to [facility]    Return if symptoms worsen or fail to improve.    Glenna Fellows, FNP

## 2021-06-20 NOTE — Progress Notes (Signed)
WET PREP reveals BV. Treated per provider order. Pt verbalized understanding of further pending lab work.  Condoms given. Lethea Killings RN

## 2021-06-21 NOTE — Progress Notes (Signed)
IUD Removal  Patient identified, informed consent performed, consent signed.  Patient was in the dorsal lithotomy position, normal external genitalia was noted.  A speculum was placed in the patient's vagina, normal discharge was noted, no lesions. The cervix was visualized, no lesions, no abnormal discharge.  The strings of the IUD were grasped and pulled using ring forceps. The IUD was removed in its entirety. Patient tolerated the procedure well.    Patient will use condoms for contraception and plans for pregnancy soon and she was told to avoid teratogens, take PNV and folic acid.  Routine preventative health maintenance measures emphasized. Glenna Fellows, FNP

## 2021-07-08 ENCOUNTER — Other Ambulatory Visit: Payer: Self-pay

## 2021-07-08 ENCOUNTER — Emergency Department
Admission: EM | Admit: 2021-07-08 | Discharge: 2021-07-08 | Disposition: A | Discharge status: Home / Self Care | Payer: Medicaid Other | Attending: Emergency Medicine | Admitting: Emergency Medicine

## 2021-07-08 ENCOUNTER — Telehealth: Payer: Self-pay | Admitting: Emergency Medicine

## 2021-07-08 ENCOUNTER — Emergency Department: Payer: Medicaid Other

## 2021-07-08 DIAGNOSIS — O99891 Other specified diseases and conditions complicating pregnancy: Secondary | ICD-10-CM

## 2021-07-08 DIAGNOSIS — Z3A01 Less than 8 weeks gestation of pregnancy: Secondary | ICD-10-CM | POA: Diagnosis not present

## 2021-07-08 DIAGNOSIS — R8271 Bacteriuria: Secondary | ICD-10-CM | POA: Insufficient documentation

## 2021-07-08 DIAGNOSIS — O2391 Unspecified genitourinary tract infection in pregnancy, first trimester: Secondary | ICD-10-CM | POA: Diagnosis not present

## 2021-07-08 DIAGNOSIS — Z3491 Encounter for supervision of normal pregnancy, unspecified, first trimester: Secondary | ICD-10-CM

## 2021-07-08 DIAGNOSIS — R1030 Lower abdominal pain, unspecified: Secondary | ICD-10-CM

## 2021-07-08 DIAGNOSIS — O26891 Other specified pregnancy related conditions, first trimester: Secondary | ICD-10-CM | POA: Diagnosis present

## 2021-07-08 LAB — CBC
HCT: 37.6 % (ref 36.0–46.0)
Hemoglobin: 12.9 g/dL (ref 12.0–15.0)
MCH: 25.9 pg — ABNORMAL LOW (ref 26.0–34.0)
MCHC: 34.3 g/dL (ref 30.0–36.0)
MCV: 75.4 fL — ABNORMAL LOW (ref 80.0–100.0)
Platelets: 319 10*3/uL (ref 150–400)
RBC: 4.99 MIL/uL (ref 3.87–5.11)
RDW: 15.6 % — ABNORMAL HIGH (ref 11.5–15.5)
WBC: 8.9 10*3/uL (ref 4.0–10.5)
nRBC: 0 % (ref 0.0–0.2)

## 2021-07-08 LAB — COMPREHENSIVE METABOLIC PANEL
ALT: 11 U/L (ref 0–44)
AST: 15 U/L (ref 15–41)
Albumin: 4 g/dL (ref 3.5–5.0)
Alkaline Phosphatase: 41 U/L (ref 38–126)
Anion gap: 6 (ref 5–15)
BUN: 15 mg/dL (ref 6–20)
CO2: 25 mmol/L (ref 22–32)
Calcium: 9.1 mg/dL (ref 8.9–10.3)
Chloride: 107 mmol/L (ref 98–111)
Creatinine, Ser: 0.84 mg/dL (ref 0.44–1.00)
GFR, Estimated: >60 mL/min (ref >60)
Glucose, Bld: 102 mg/dL — ABNORMAL HIGH (ref 70–99)
Potassium: 3.5 mmol/L (ref 3.5–5.1)
Sodium: 138 mmol/L (ref 135–145)
Total Bilirubin: 0.8 mg/dL (ref 0.3–1.2)
Total Protein: 7.5 g/dL (ref 6.5–8.1)

## 2021-07-08 LAB — URINALYSIS, ROUTINE W REFLEX MICROSCOPIC
Bilirubin Urine: NEGATIVE
Glucose, UA: NEGATIVE mg/dL
Hgb urine dipstick: NEGATIVE
Ketones, ur: NEGATIVE mg/dL
Nitrite: NEGATIVE
Protein, ur: 30 mg/dL — AB
Specific Gravity, Urine: 1.03 (ref 1.005–1.030)
pH: 5 (ref 5.0–8.0)

## 2021-07-08 LAB — HCG, QUANTITATIVE, PREGNANCY: hCG, Beta Chain, Quant, S: 863 m[IU]/mL — ABNORMAL HIGH (ref <5)

## 2021-07-08 LAB — LIPASE, BLOOD: Lipase: 29 U/L (ref 11–51)

## 2021-07-08 LAB — POC URINE PREG, ED: Preg Test, Ur: POSITIVE — AB

## 2021-07-08 MED ORDER — DOXYLAMINE-PYRIDOXINE 10-10 MG PO TBEC: DELAYED_RELEASE_TABLET | ORAL | 0 refills | Status: DC

## 2021-07-08 MED ORDER — CEPHALEXIN 500 MG PO CAPS: 500.0000 mg | ORAL_CAPSULE | Freq: Three times a day (TID) | ORAL | 0 refills | Status: AC

## 2021-07-08 MED ORDER — ONDANSETRON 4 MG PO TBDP
4.0000 mg | ORAL_TABLET | Freq: Once | ORAL | Status: AC
  Administered 2021-07-08: 4 mg via ORAL
  Filled 2021-07-08: qty 1

## 2021-07-08 MED ORDER — PRENATAL 28-0.8 MG PO TABS: 1.0000 | ORAL_TABLET | Freq: Every day | ORAL | 0 refills | Status: DC

## 2021-07-08 NOTE — Telephone Encounter (Signed)
See ED note from today. Meds called in after d/c as pt had to leave abruptly. She is aware and will follow-up with MyChart.

## 2021-07-08 NOTE — ED Triage Notes (Addendum)
Pt comes with c/o abdominal pain. Pt states + preg test week ago. Pt states symptoms started 1-2 weeks ago. Pt states some nausea and cramping. Pt states little vag bleeding week ago. Pt state some vomiting and not able to keep anything down.

## 2021-07-08 NOTE — ED Provider Notes (Signed)
[facility] Provider Note    Event Date/Time   First MD Initiated Contact with Patient 07/08/21 1850     (approximate)   History   Abdominal Pain   HPI  Carrie Frazier is a 26 y.o. female G3 P4 at estimated 4 weeks 4 days gestational age here with lower abdominal pain and cramping.  The patient states that over the last 2 to 3 days, she has had intermittent aching, cramp-like, abdominal discomfort.  This feels somewhat like period cramps.  It is not severe.  She is also noticed a single episode of spotting several days ago, but this has also resolved.  She states that she has been nauseous, intermittently unable to eat or drink in the mornings.  She has had general fatigue.  She states that because of her pain, and a positive pregnancy test at home, she presents for further evaluation.  No history of ectopic pregnancies.  No ongoing bleeding.  No urinary symptoms.     Physical Exam   Triage Vital Signs: ED Triage Vitals [07/08/21 1702]  Enc Vitals Group     BP 101/64     Pulse Rate 97     Resp 18     Temp 98 F (36.7 C)     Temp src      SpO2 100 %     Weight      Height      Head Circumference      Peak Flow      Pain Score      Pain Loc      Pain Edu?      Excl. in GC?     Most recent vital signs: Vitals:   07/08/21 1702 07/08/21 2003  BP: 101/64 113/84  Pulse: 97 67  Resp: 18 14  Temp: 98 F (36.7 C)   SpO2: 100% 100%     General: Awake, no distress.  CV:  Good peripheral perfusion. RRR.  Resp:  Normal effort. Lungs CTAB. Abd:  No distention. No abd TTP. No significant uterine enlargement appreciated. Other:  Well appearing and in NAD.   ED Results / Procedures / Treatments   Labs (all labs ordered are listed, but only abnormal results are displayed) Labs Reviewed  COMPREHENSIVE METABOLIC PANEL - Abnormal; Notable for the following components:      Result Value   Glucose, Bld 102 (*)    All other components within  normal limits  CBC - Abnormal; Notable for the following components:   MCV 75.4 (*)    MCH 25.9 (*)    RDW 15.6 (*)    All other components within normal limits  URINALYSIS, ROUTINE W REFLEX MICROSCOPIC - Abnormal; Notable for the following components:   Color, Urine AMBER (*)    APPearance CLOUDY (*)    Protein, ur 30 (*)    Leukocytes,Ua MODERATE (*)    Bacteria, UA RARE (*)    All other components within normal limits  HCG, QUANTITATIVE, PREGNANCY - Abnormal; Notable for the following components:   hCG, Beta Chain, Quant, S 863 (*)    All other components within normal limits  POC URINE PREG, ED - Abnormal; Notable for the following components:   Preg Test, Ur POSITIVE (*)    All other components within normal limits  LIPASE, BLOOD     EKG    RADIOLOGY [place]: Probable early intrauterine gestational sac, normal ovaries   I also independently reviewed and agree with radiologist interpretations.  PROCEDURES:  Critical Care performed: No   MEDICATIONS ORDERED IN ED: Medications  ondansetron (ZOFRAN-ODT) disintegrating tablet 4 mg (4 mg Oral Given 07/08/21 2003)     IMPRESSION / MDM / ASSESSMENT AND PLAN / ED COURSE  I reviewed the triage vital signs and the nursing notes.                               The patient is on the cardiac monitor to evaluate for evidence of arrhythmia and/or significant heart rate changes.   Ddx:  Differential includes the following, with pertinent life- or limb-threatening emergencies considered:  Implantation bleeding/discomfort, threatened AB, subchorionic hemorrhage, UTI, stone, ovarian cyst, torsion, proctitis/colitis/diverticulitis, viral syndrome  Patient's presentation is most consistent with acute presentation with potential threat to life or bodily function.  MDM:  Pleasant 26 yo F here with lower abd cramping, nausea, vomiting. Pt is well appearing on exam, well hydrated. Bleeding was light spotting and is now resolved.  Rh+, Rhogam not indicated. CBC with no leukocytosis or anemia. CMP unremarkable. UPT positive. hCG 863.US OB shows probable intrauterine gestational sac measuring [redacted]w[redacted]d. F/u U/S in 14 days recommended.   Suspect mild implantation related pain. Pt had to leave due to her ride being present but no apparent emergent pathology noted at this time. She is well appearing, tolerating PO. No ongoing vomiting.  UA returned with bacteriuria, could be contaminant but might also represent UTI. Will send in Keflex, prenatals, dlcegis. Pt should call her OBGYN or local health department for repeat HCG check in 48 hours, and follow-up this week ideally.    MEDICATIONS GIVEN IN ED: Medications  ondansetron (ZOFRAN-ODT) disintegrating tablet 4 mg (4 mg Oral Given 07/08/21 2003)     Consults:  None   EMR reviewed  None     FINAL CLINICAL IMPRESSION(S) / ED DIAGNOSES   Final diagnoses:  First trimester pregnancy  Bacteriuria during pregnancy     Rx / DC Orders   ED Discharge Orders     None        Note:  This document was prepared using Dragon voice recognition software and may include unintentional dictation errors.   Shaune Pollack, MD 07/08/21 [phone]

## 2021-07-08 NOTE — ED Provider Triage Note (Signed)
Emergency Medicine Provider Triage Evaluation Note  Carrie Frazier , a 26 y.o. female G3P2 was evaluated in triage.  Pt complains of nausea and abdominal pain. No dysuria. LMP June 1. Home preg test 2 weeks ago was positive. Spotting 1 week ago. No other vaginal discharge.   Review of Systems  Positive: Abdominal pain, N/V Negative: Vag discharge, fever, dysuria  Physical Exam  LMP 06/06/2021 (Exact Date)  Gen:   Awake, no distress   Resp:  Normal effort  MSK:   Moves extremities without difficulty  Other:    Medical Decision Making  Medically screening exam initiated at 4:59 PM.  Appropriate orders placed.  Carrie Frazier was informed that the remainder of the evaluation will be completed by another provider, this initial triage assessment does not replace that evaluation, and the importance of remaining in the ED until their evaluation is complete.     Carrie Hoehn, PA-C 07/08/21 1707

## 2021-07-09 ENCOUNTER — Encounter: Payer: Self-pay | Admitting: Emergency Medicine

## 2021-07-24 ENCOUNTER — Emergency Department
Admission: EM | Admit: 2021-07-24 | Discharge: 2021-07-24 | Disposition: A | Discharge status: Home / Self Care | Payer: Medicaid Other | Attending: Emergency Medicine | Admitting: Emergency Medicine

## 2021-07-24 ENCOUNTER — Other Ambulatory Visit: Payer: Self-pay

## 2021-07-24 ENCOUNTER — Emergency Department: Payer: Medicaid Other

## 2021-07-24 DIAGNOSIS — N390 Urinary tract infection, site not specified: Secondary | ICD-10-CM | POA: Insufficient documentation

## 2021-07-24 DIAGNOSIS — F17210 Nicotine dependence, cigarettes, uncomplicated: Secondary | ICD-10-CM | POA: Insufficient documentation

## 2021-07-24 DIAGNOSIS — O021 Missed abortion: Secondary | ICD-10-CM | POA: Diagnosis present

## 2021-07-24 DIAGNOSIS — D72829 Elevated white blood cell count, unspecified: Secondary | ICD-10-CM | POA: Insufficient documentation

## 2021-07-24 LAB — CBC
HCT: 36.1 % (ref 36.0–46.0)
Hemoglobin: 12.5 g/dL (ref 12.0–15.0)
MCH: 26 pg (ref 26.0–34.0)
MCHC: 34.6 g/dL (ref 30.0–36.0)
MCV: 75.1 fL — ABNORMAL LOW (ref 80.0–100.0)
Platelets: 274 10*3/uL (ref 150–400)
RBC: 4.81 MIL/uL (ref 3.87–5.11)
RDW: 15.3 % (ref 11.5–15.5)
WBC: 13.6 10*3/uL — ABNORMAL HIGH (ref 4.0–10.5)
nRBC: 0 % (ref 0.0–0.2)

## 2021-07-24 LAB — URINALYSIS, ROUTINE W REFLEX MICROSCOPIC
Bilirubin Urine: NEGATIVE
Glucose, UA: NEGATIVE mg/dL
Hgb urine dipstick: NEGATIVE
Ketones, ur: NEGATIVE mg/dL
Nitrite: NEGATIVE
Protein, ur: NEGATIVE mg/dL
RBC / HPF: >50 RBC/hpf — ABNORMAL HIGH (ref 0–5)
Specific Gravity, Urine: 1.025 (ref 1.005–1.030)
WBC, UA: >50 WBC/hpf — ABNORMAL HIGH (ref 0–5)
pH: 5 (ref 5.0–8.0)

## 2021-07-24 LAB — COMPREHENSIVE METABOLIC PANEL
ALT: 12 U/L (ref 0–44)
AST: 14 U/L — ABNORMAL LOW (ref 15–41)
Albumin: 4 g/dL (ref 3.5–5.0)
Alkaline Phosphatase: 44 U/L (ref 38–126)
Anion gap: 5 (ref 5–15)
BUN: 16 mg/dL (ref 6–20)
CO2: 24 mmol/L (ref 22–32)
Calcium: 8.9 mg/dL (ref 8.9–10.3)
Chloride: 106 mmol/L (ref 98–111)
Creatinine, Ser: 0.83 mg/dL (ref 0.44–1.00)
GFR, Estimated: >60 mL/min (ref >60)
Glucose, Bld: 87 mg/dL (ref 70–99)
Potassium: 4 mmol/L (ref 3.5–5.1)
Sodium: 135 mmol/L (ref 135–145)
Total Bilirubin: 0.7 mg/dL (ref 0.3–1.2)
Total Protein: 7.5 g/dL (ref 6.5–8.1)

## 2021-07-24 LAB — HCG, QUANTITATIVE, PREGNANCY: hCG, Beta Chain, Quant, S: 32924 m[IU]/mL — ABNORMAL HIGH (ref <5)

## 2021-07-24 LAB — LIPASE, BLOOD: Lipase: 27 U/L (ref 11–51)

## 2021-07-24 MED ORDER — ONDANSETRON 4 MG PO TBDP: 4.0000 mg | ORAL_TABLET | Freq: Three times a day (TID) | ORAL | 0 refills | Status: AC | PRN

## 2021-07-24 MED ORDER — ONDANSETRON HCL 4 MG/2ML IJ SOLN
4.0000 mg | Freq: Once | INTRAMUSCULAR | Status: AC
  Administered 2021-07-24: 4 mg via INTRAVENOUS
  Filled 2021-07-24: qty 2

## 2021-07-24 MED ORDER — DEXTROSE IN LACTATED RINGERS 5 % IV SOLN: Freq: Once | INTRAVENOUS | Status: AC

## 2021-07-24 MED ORDER — NITROFURANTOIN MONOHYD MACRO 100 MG PO CAPS: 100.0000 mg | ORAL_CAPSULE | Freq: Two times a day (BID) | ORAL | 0 refills | Status: AC

## 2021-07-24 NOTE — ED Notes (Signed)
ED Provider at bedside. 

## 2021-07-24 NOTE — Discharge Instructions (Signed)
Your ultrasound shows a fetus but there is no heartbeat and given the size of the fetus the pregnancy is nonviable.  Please follow-up with [facility] clinic OB/GYN.  You will likely start to have significant bleeding.  If you are feeling a pad more frequently than once every 2 hours please return to the emergency department.  You can take 400 mg of ibuprofen every 6 hours for pain.  I am also prescribing you Zofran for nausea.  If you develop fevers please return to the emergency department.  Your urine had significant amount of white blood cells so I am also giving you an antibiotic for urinary tract infection.

## 2021-07-24 NOTE — ED Triage Notes (Signed)
Pt to ED for nausea and vomiting in pregnancy. Unable to keep down food or fluids for 1 week. Requesting IV fluids. Pt is about 2 months pregnant, unsure of EDD. LNMP around 06/06/21.   Pt also complains of cramplike discomfort to lower abdomen. Denies vaginal bleeding. Pt is G3P2.  Pt states feels weak, also has HA.

## 2021-07-24 NOTE — ED Provider Triage Note (Signed)
Emergency Medicine Provider Triage Evaluation Note  Carrie Frazier, a 26 y.o. female G3P2 was evaluated in triage.  Pt complains of nausea and vomiting.  Patient is approximately [redacted]w[redacted]d patient age with LMP reported as 06/06/2021.  She gives 1 week complaint of hyperemesis.  She reports some generalized abdominal cramping secondary to vomiting, but denies any vaginal bleeding or vaginal discharge.  She is scheduled for her initial OB visit with [facility] next week.  Review of Systems  Positive: NV Negative: Vag bleeding  Physical Exam  BP 117/75 (BP Location: Left Arm)   Pulse 80   Temp 98.6 F (37 C) (Oral)   Resp 15   Ht 5\' 3"  (1.6 m)   Wt 62.6 kg   LMP 06/06/2021 (Exact Date)   SpO2 100%   BMI 24.45 kg/m  Gen:   Awake, no distress  NAD Resp:  Normal effort CTA MSK:   Moves extremities without difficulty  ABD:  Soft, nontender  Medical Decision Making  Medically screening exam initiated at 5:43 PM.  Appropriate orders placed.  Carrie Frazier was informed that the remainder of the evaluation will be completed by another provider, this initial triage assessment does not replace that evaluation, and the importance of remaining in the ED until their evaluation is complete.  Female patient at home oh [redacted] weeks gestation, presents with persistent nausea vomiting for the last week.  She denies any pregnancy related complaints at this time.   Blondell Reveal, PA-C 07/24/21 1746

## 2021-07-24 NOTE — ED Provider Notes (Signed)
[facility] Provider Note    Event Date/Time   First MD Initiated Contact with Patient 07/24/21 Paulo Fruit     (approximate)   History   Emesis and Nausea   HPI  Carrie Frazier is a 26 y.o. female past medical history of anemia during pregnancy, she 3 P2 who presents with nausea and vomiting.  She has not been able to tolerate p.o. for the past week at home.  Also complains of left lower abdominal pain which has been going on for several weeks.  She had her IUD removed June 15 and had a positive pregnancy test 2 weeks later.  She was seen here on 7/3 for abdominal pain and craming, HCG was in the 800s and pelvic ultrasound showed a gestational sac but no yolk sac.  Patient denies fevers chills or urinary symptoms.  No vaginal bleeding.  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 --      Excl. in GC? --     Most recent vital signs: Vitals:   07/24/21 2140 07/24/21 2258  BP: 121/74   Pulse: 90 85  Resp: 18 18  Temp: 98.2 F (36.8 C)   SpO2: 99% 99%     General: Awake, no distress.  CV:  Good peripheral perfusion.   Resp:  Normal effort.  Abd:  No distention.  Mild tenderness to palpation in the suprapubic region left lower quadrant Neuro:             Awake, Alert, Oriented x 3  Other:     ED Results / Procedures / Treatments  Labs (all labs ordered are listed, but only abnormal results are displayed) Labs Reviewed  COMPREHENSIVE METABOLIC PANEL - Abnormal; Notable for the following components:      Result Value   AST 14 (*)    All other components within normal limits  CBC - Abnormal; Notable for the following components:   WBC 13.6 (*)    MCV 75.1 (*)    All other components within normal limits  URINALYSIS, ROUTINE W REFLEX MICROSCOPIC - Abnormal; Notable for the following components:   Color, Urine YELLOW (*)    APPearance CLOUDY (*)    Leukocytes,Ua LARGE (*)    RBC / HPF >50 (*)    WBC, UA >  50 (*)    Bacteria, UA RARE (*)    All other components within normal limits  HCG, QUANTITATIVE, PREGNANCY - Abnormal; Notable for the following components:   hCG, Beta Chain, Quant, S 32,924 (*)    All other components within normal limits  LIPASE, BLOOD     EKG     RADIOLOGY  Ultrasound of the pelvis transvaginal abdominal interpreted by myself shows no fetal heart rate  PROCEDURES:  Critical Care performed: No  .1-3 Lead EKG Interpretation  Performed by: Georga Hacking, MD Authorized by: Georga Hacking, MD     Interpretation: normal     ECG rate assessment: normal     Rhythm: sinus rhythm     Ectopy: none     Conduction: normal     The patient is on the cardiac monitor to evaluate for evidence of arrhythmia and/or significant heart rate changes.   MEDICATIONS ORDERED IN ED: Medications  ondansetron (ZOFRAN) injection 4 mg (4 mg Intravenous Given 07/24/21 1756)  dextrose 5 % in lactated ringers infusion (0 mLs Intravenous Stopped 07/24/21 2030)     IMPRESSION / MDM / ASSESSMENT AND PLAN / ED COURSE  I reviewed the triage vital signs and the nursing notes.                               Patient's presentation is most consistent with acute complicated illness / injury requiring diagnostic workup.  Differential diagnosis includes, but is not limited to, hyperemesis, molar pregnancy, gastroenteritis   The patient is a 26 year old female who is in her first trimester who presents today with nausea and vomiting.  Symptoms been going on for about a week really not able to tolerate p.o. not taking any antiemetics at home.  No vaginal bleeding but does continue to have some left lower quadrant abdominal pain which has been steady for several weeks.  She was seen in the ED about 16 days ago and had an hCG that was around 800 and a first trimester ultrasound that showed a gestational sac but no definite IUP.  Currently she looks well vitals are within normal limits after receiving Zofran and fluids in triage patient now is feeling hungry and no longer nauseous.  Labs are notable for mild leukocytosis but otherwise within normal limits.  Beta-hCG is 32,000.  I performed a bedside ultrasound I do not see an obvious fetal pole or heart rate but I do see likely yolk sac.  Would expect given the timing and her hCG to see a fetal pole.  Will order formal ultrasound.  UA is pending.    Patient's pelvic ultrasound unfortunately shows a fetal pleural breasts 9 mm but no heart rate consistent with nonviable pregnancy.  Discussed the results with the patient.  We discussed medical management, expectant management and surgical management.  She will follow-up with [facility] OB[facility]GYN.  UA with greater than 50 WBCs and RBCs, this could be in the setting of vaginal spotting although she denies bleeding to me.  Will treat with Macrobid in case of UTI.  Will prescribe Zofran for nausea.  She is B+ so no indication for RhoGAM   FINAL CLINICAL IMPRESSION(S) / ED DIAGNOSES   Final diagnoses:  Missed abortion  Urinary tract infection without hematuria, site unspecified     Rx /  DC Orders   ED Discharge Orders          Ordered  nitrofurantoin, macrocrystal-monohydrate, (MACROBID) 100 MG capsule  2 times daily        07/24/21 2234    ondansetron (ZOFRAN-ODT) 4 MG disintegrating tablet  Every 8 hours PRN        07/24/21 2234             Note:  This document was prepared using Dragon voice recognition software and may include unintentional dictation errors.   Georga Hacking, MD 07/25/21 [phone]

## 2021-07-24 NOTE — ED Notes (Signed)
Pt taken to US

## 2021-08-16 DIAGNOSIS — O0990 Supervision of high risk pregnancy, unspecified, unspecified trimester: Secondary | ICD-10-CM | POA: Insufficient documentation

## 2021-08-16 DIAGNOSIS — O0993 Supervision of high risk pregnancy, unspecified, third trimester: Secondary | ICD-10-CM | POA: Insufficient documentation

## 2021-08-16 HISTORY — DX: Supervision of high risk pregnancy, unspecified, unspecified trimester: O09.90

## 2021-08-17 DIAGNOSIS — O368910 Maternal care for other specified fetal problems, first trimester, not applicable or unspecified: Secondary | ICD-10-CM | POA: Insufficient documentation

## 2021-08-17 DIAGNOSIS — Z3A10 10 weeks gestation of pregnancy: Secondary | ICD-10-CM | POA: Insufficient documentation

## 2021-08-17 DIAGNOSIS — O209 Hemorrhage in early pregnancy, unspecified: Secondary | ICD-10-CM | POA: Diagnosis present

## 2021-08-18 ENCOUNTER — Emergency Department
Admission: EM | Admit: 2021-08-18 | Discharge: 2021-08-18 | Disposition: A | Discharge status: Home / Self Care | Payer: Medicaid Other | Attending: Emergency Medicine | Admitting: Emergency Medicine

## 2021-08-18 ENCOUNTER — Emergency Department: Payer: Medicaid Other

## 2021-08-18 ENCOUNTER — Other Ambulatory Visit: Payer: Self-pay

## 2021-08-18 DIAGNOSIS — O418X10 Other specified disorders of amniotic fluid and membranes, first trimester, not applicable or unspecified: Secondary | ICD-10-CM

## 2021-08-18 DIAGNOSIS — O200 Threatened abortion: Secondary | ICD-10-CM

## 2021-08-18 DIAGNOSIS — N939 Abnormal uterine and vaginal bleeding, unspecified: Secondary | ICD-10-CM

## 2021-08-18 DIAGNOSIS — O4690 Antepartum hemorrhage, unspecified, unspecified trimester: Secondary | ICD-10-CM

## 2021-08-18 LAB — CBC WITH DIFFERENTIAL/PLATELET
Abs Immature Granulocytes: 0.04 10*3/uL (ref 0.00–0.07)
Basophils Absolute: 0 10*3/uL (ref 0.0–0.1)
Basophils Relative: 0 %
Eosinophils Absolute: 0 10*3/uL (ref 0.0–0.5)
Eosinophils Relative: 0 %
HCT: 35.8 % — ABNORMAL LOW (ref 36.0–46.0)
Hemoglobin: 12.3 g/dL (ref 12.0–15.0)
Immature Granulocytes: 0 %
Lymphocytes Relative: 14 %
Lymphs Abs: 1.6 10*3/uL (ref 0.7–4.0)
MCH: 26.1 pg (ref 26.0–34.0)
MCHC: 34.4 g/dL (ref 30.0–36.0)
MCV: 76 fL — ABNORMAL LOW (ref 80.0–100.0)
Monocytes Absolute: 0.7 10*3/uL (ref 0.1–1.0)
Monocytes Relative: 6 %
Neutro Abs: 8.9 10*3/uL — ABNORMAL HIGH (ref 1.7–7.7)
Neutrophils Relative %: 80 %
Platelets: 242 10*3/uL (ref 150–400)
RBC: 4.71 MIL/uL (ref 3.87–5.11)
RDW: 15.3 % (ref 11.5–15.5)
WBC: 11.3 10*3/uL — ABNORMAL HIGH (ref 4.0–10.5)
nRBC: 0 % (ref 0.0–0.2)

## 2021-08-18 LAB — COMPREHENSIVE METABOLIC PANEL
ALT: 12 U/L (ref 0–44)
AST: 13 U/L — ABNORMAL LOW (ref 15–41)
Albumin: 4 g/dL (ref 3.5–5.0)
Alkaline Phosphatase: 46 U/L (ref 38–126)
Anion gap: 7 (ref 5–15)
BUN: 15 mg/dL (ref 6–20)
CO2: 24 mmol/L (ref 22–32)
Calcium: 9 mg/dL (ref 8.9–10.3)
Chloride: 104 mmol/L (ref 98–111)
Creatinine, Ser: 0.77 mg/dL (ref 0.44–1.00)
GFR, Estimated: >60 mL/min (ref >60)
Glucose, Bld: 120 mg/dL — ABNORMAL HIGH (ref 70–99)
Potassium: 3.6 mmol/L (ref 3.5–5.1)
Sodium: 135 mmol/L (ref 135–145)
Total Bilirubin: 0.7 mg/dL (ref 0.3–1.2)
Total Protein: 8 g/dL (ref 6.5–8.1)

## 2021-08-18 LAB — HCG, QUANTITATIVE, PREGNANCY: hCG, Beta Chain, Quant, S: 209468 m[IU]/mL — ABNORMAL HIGH (ref <5)

## 2021-08-18 LAB — URINALYSIS, ROUTINE W REFLEX MICROSCOPIC
Bilirubin Urine: NEGATIVE
Glucose, UA: NEGATIVE mg/dL
Hgb urine dipstick: NEGATIVE
Ketones, ur: NEGATIVE mg/dL
Nitrite: NEGATIVE
Protein, ur: 30 mg/dL — AB
RBC / HPF: >50 RBC/hpf — ABNORMAL HIGH (ref 0–5)
Specific Gravity, Urine: 1.026 (ref 1.005–1.030)
WBC, UA: >50 WBC/hpf — ABNORMAL HIGH (ref 0–5)
pH: 6 (ref 5.0–8.0)

## 2021-08-18 LAB — POC URINE PREG, ED: Preg Test, Ur: POSITIVE — AB

## 2021-08-18 MED ORDER — DEXTROSE 5 % IN LACTATED RINGERS IV BOLUS
1000.0000 mL | Freq: Once | INTRAVENOUS | Status: AC
  Administered 2021-08-18: 1000 mL via INTRAVENOUS
  Filled 2021-08-18: qty 1000

## 2021-08-18 NOTE — ED Provider Notes (Signed)
[facility] Provider Note    Event Date/Time   First MD Initiated Contact with Patient 08/18/21 0413     (approximate)   History   Vaginal Bleeding   HPI  Carrie Frazier is a 26 y.o. female who presents to the ED for evaluation of Vaginal Bleeding   I reviewed ED visit from 7/19 where patient was unfortunately diagnosed with a nonviable pregnancy due to a large subchorionic hemorrhage with no fetal cardiac activity.  Rh+.  Given Macrobid for possible UTI.  Patient returns to the ED for evaluation of vaginal spotting.  She reports lower abdominal cramping alongside this that is been worsening for the past couple days.  She reports light bleeding/spotting without any passage of clots or other novel discharge.  No diarrhea, emesis or fever.  Physical Exam   Triage Vital Signs: ED Triage Vitals  Enc Vitals Group     BP 08/18/21 0016 120/82     Pulse Rate 08/18/21 0016 87     Resp 08/18/21 0016 18     Temp 08/18/21 0016 98.2 F (36.8 C)     Temp Source 08/18/21 0016 Oral     SpO2 08/18/21 0016 97 %     Weight 08/18/21 0014 140 lb (63.5 kg)     Height 08/18/21 0014 5\' 3"  (1.6 m)     Head Circumference --      Peak Flow --      Pain Score 08/18/21 0014 10     Pain Loc --      Pain Edu? --      Excl. in GC? --     Most recent vital signs: Vitals:   08/18/21 0331 08/18/21 0431  BP: 111/66 106/66  Pulse: 66 72  Resp: 18 18  Temp: 98.2 F (36.8 C)   SpO2: 100% 100%    General: Awake, no distress.  CV:  Good peripheral perfusion.  Resp:  Normal effort.  Abd:  No distention.  Soft and benign throughout MSK:  No deformity noted.  Neuro:  No focal deficits appreciated. Other:     ED Results / Procedures / Treatments   Labs (all labs ordered are listed, but only abnormal results are displayed) Labs Reviewed  CBC WITH DIFFERENTIAL/PLATELET - Abnormal; Notable for the following components:      Result Value   WBC 11.3 (*)    HCT  35.8 (*)    MCV 76.0 (*)    Neutro Abs 8.9 (*)    All other components within normal limits  COMPREHENSIVE METABOLIC PANEL - Abnormal; Notable for the following components:   Glucose, Bld 120 (*)    AST 13 (*)    All other components within normal limits  URINALYSIS, ROUTINE W REFLEX MICROSCOPIC - Abnormal; Notable for the following components:   Color, Urine YELLOW (*)    APPearance CLOUDY (*)    Protein, ur 30 (*)    Leukocytes,Ua LARGE (*)    RBC / HPF >50 (*)    WBC, UA >50 (*)    Bacteria, UA RARE (*)    All other components within normal limits  POC URINE PREG, ED - Abnormal; Notable for the following components:   Preg Test, Ur Positive (*)    All other components within normal limits  HCG, QUANTITATIVE, PREGNANCY    EKG   RADIOLOGY Obstetric ultrasound interpreted by me with IUP, subchorionic hemorrhage and fetal heart rate of 180  Official radiology report(s): 08/20/21 OB Comp Less  14 Wks  Result Date: 08/18/2021 CLINICAL DATA:  26 year old female with vaginal bleeding in the 1st trimester of pregnancy. Quantitative beta HCG pending. Estimated gestational age by LMP 10 weeks and 3 days. EXAM: OBSTETRIC <14 WK ULTRASOUND TECHNIQUE: Transabdominal ultrasound was performed for evaluation of the gestation as well as the maternal uterus and adnexal regions. COMPARISON:  07/24/2021 FINDINGS: Intrauterine gestational sac: Single Yolk sac:  Visible Embryo:  Visible Cardiac Activity: Detected Heart Rate: 180 bpm MSD:  37.8 mm   9 w   1 d CRL:   31.6 mm   10 w 0 d                  [place] EDC: 03/16/2022 Subchorionic hemorrhage: Small to moderate volume (series 1, image 34). Maternal uterus/adnexae: No pelvic free fluid. Right ovary is 3.5 x 2.1 x 2.7 cm. Left ovary is 3.8 x 3.2 x 2.3 cm and might contain the corpus luteum. IMPRESSION: 1. Single living IUP demonstrated with estimated gestational age of [redacted] weeks and 0 days by crown-rump length. 2. Small to moderate volume subchorionic hemorrhage.  Normal ovaries, no free fluid. Electronically Signed   By: Odessa Fleming M.D.   On: 08/18/2021 04:23    PROCEDURES and INTERVENTIONS:  Procedures  Medications - No data to display   IMPRESSION / MDM / ASSESSMENT AND PLAN / ED COURSE  I reviewed the triage vital signs and the nursing notes.  Differential diagnosis includes, but is not limited to, miscarriage, STI, sepsis, trauma, subchorionic hemorrhage  {Patient presents with symptoms of an acute illness or injury that is potentially life-threatening.  26 year old female in first trimester presents to the ED with vaginal bleeding, with evidence of a possible threatened miscarriage and a symptomatic subchorionic hematoma, but suitable for outpatient management with close OB follow-up.  Stable hemoglobin on CBC, metabolic panel is reassuring and essentially normal.  hCG continues to rise.  Interestingly, when she was here nearly a month ago there was no fetal cardiac activity noted on ultrasound, but is noted today and is seemingly appropriate with continued rising hCG and size of the fetus.  Subchorionic hematoma is present and somewhat smaller in size.  She has no urinary symptoms and I doubt her urinalysis represents cystitis, likely due to her vaginal bleeding.  We will have her follow-up closely with OB/GYN and we discussed return precautions for the ED.      FINAL CLINICAL IMPRESSION(S) / ED DIAGNOSES   Final diagnoses:  Vaginal bleeding in pregnancy     Rx / DC Orders   ED Discharge Orders     None        Note:  This document was prepared using Dragon voice recognition software and may include unintentional dictation errors.   Delton Prairie, MD 08/18/21 [phone]

## 2021-08-18 NOTE — Discharge Instructions (Addendum)
Use Tylenol for pain and fevers.  Up to 1000 mg per dose, up to 4 times per day.  Do not take more than 4000 mg of Tylenol/acetaminophen within 24 hours..  

## 2021-08-18 NOTE — ED Triage Notes (Signed)
Pt reports [redacted] wks pregnant and with dx of subchorionic hemorrhage. Reports vaginal bleeding tonight and states "it's like spotting." Reports lower abd and pelvic pain. Pt in NAD in triage.

## 2021-08-26 LAB — OB RESULTS CONSOLE HIV ANTIBODY (ROUTINE TESTING): HIV: NONREACTIVE

## 2021-08-26 LAB — OB RESULTS CONSOLE RUBELLA ANTIBODY, IGM: Rubella: IMMUNE

## 2021-08-26 LAB — OB RESULTS CONSOLE GC/CHLAMYDIA
Chlamydia: NEGATIVE
Neisseria Gonorrhea: NEGATIVE

## 2021-08-26 LAB — OB RESULTS CONSOLE RPR: RPR: NONREACTIVE

## 2021-08-26 LAB — OB RESULTS CONSOLE VARICELLA ZOSTER ANTIBODY, IGG: Varicella: IMMUNE

## 2021-08-26 LAB — HEPATITIS C ANTIBODY: HCV Ab: NEGATIVE

## 2021-08-26 LAB — OB RESULTS CONSOLE GBS: GBS: POSITIVE

## 2021-10-24 ENCOUNTER — Other Ambulatory Visit: Payer: Self-pay | Admitting: Certified Nurse Midwife

## 2021-10-24 DIAGNOSIS — Z3689 Encounter for other specified antenatal screening: Secondary | ICD-10-CM

## 2021-11-26 ENCOUNTER — Ambulatory Visit: Payer: 59

## 2021-12-03 ENCOUNTER — Ambulatory Visit: Payer: 59 | Attending: Obstetrics and Gynecology

## 2021-12-03 ENCOUNTER — Other Ambulatory Visit: Payer: Self-pay

## 2021-12-03 DIAGNOSIS — Z363 Encounter for antenatal screening for malformations: Secondary | ICD-10-CM | POA: Insufficient documentation

## 2021-12-03 DIAGNOSIS — Z3A25 25 weeks gestation of pregnancy: Secondary | ICD-10-CM | POA: Insufficient documentation

## 2021-12-03 DIAGNOSIS — O99012 Anemia complicating pregnancy, second trimester: Secondary | ICD-10-CM | POA: Insufficient documentation

## 2021-12-03 DIAGNOSIS — O99332 Smoking (tobacco) complicating pregnancy, second trimester: Secondary | ICD-10-CM | POA: Diagnosis not present

## 2021-12-03 DIAGNOSIS — Z87891 Personal history of nicotine dependence: Secondary | ICD-10-CM | POA: Diagnosis not present

## 2021-12-03 DIAGNOSIS — Z3689 Encounter for other specified antenatal screening: Secondary | ICD-10-CM

## 2021-12-03 DIAGNOSIS — D649 Anemia, unspecified: Secondary | ICD-10-CM

## 2021-12-26 ENCOUNTER — Ambulatory Visit: Payer: 59

## 2022-01-06 NOTE — L&D Delivery Note (Signed)
Delivery Note  Date of delivery: 03/17/2022 Estimated Date of Delivery: 03/13/22 Patient's last menstrual period was 06/06/2021 (exact date). EGA: [redacted]w[redacted]d Delivery Note At 1:23 AM a viable female was delivered via Vaginal, Spontaneous (Presentation: ROA).  APGAR:9, 9; weight  pending.   Placenta status:  Intact, spontaneous.  Cord: 3 vessels with the following complications: None.    First Stage: Labor onset: 1930 Augmentation : AROM, Cytotec, and Pitocin Analgesia /Anesthesia intrapartum: Epidural AROM at 1Zebapresented to L&D for IOL. She was induced with cytotec, AROM, and pitocin. Epidural placed for pain relief.   Second Stage: Complete dilation at 0105 Onset of pushing at 0112 FHR second stage Cat I and Cat II Delivery at [place] 03/17/2022  She progressed to complete and had a spontaneous vaginal birth of a live female over an intact perineum. The fetal head was delivered in OA position with restitution to LOA then full rotation to ROA with a compound hand. No nuchal cord. Anterior then posterior shoulders delivered spontaneously. Baby placed on mom's abdomen and attended to by transition RN. Cord clamped and cut after 1+ min by pt's mother.   Third Stage: Placenta delivered intact with 3VC at 0136 Placenta disposition: routine disposal Uterine tone firm / bleeding min IV pitocin given for hemorrhage prophylaxis  Anesthesia: Epidural Episiotomy:  none Lacerations:  small superfical tear, no repair Suture Repair: n/a Est. Blood Loss (mL):  [id number] Complications: none  Mom to postpartum.  Baby to Couplet care / Skin to Skin.  Newborn: Birth Weight: pending  Apgar Scores: 9, 9 Feeding planned: breast   JClydene Laming CNM 03/17/2022 1:43 AM

## 2022-01-20 ENCOUNTER — Encounter: Payer: Self-pay | Admitting: Obstetrics and Gynecology

## 2022-01-20 ENCOUNTER — Other Ambulatory Visit: Payer: Self-pay

## 2022-01-20 ENCOUNTER — Observation Stay
Admission: EM | Admit: 2022-01-20 | Discharge: 2022-01-20 | Disposition: A | Discharge status: Home / Self Care | Payer: Medicaid Other

## 2022-01-20 DIAGNOSIS — Z79899 Other long term (current) drug therapy: Secondary | ICD-10-CM | POA: Insufficient documentation

## 2022-01-20 DIAGNOSIS — O99891 Other specified diseases and conditions complicating pregnancy: Secondary | ICD-10-CM | POA: Diagnosis not present

## 2022-01-20 DIAGNOSIS — M549 Dorsalgia, unspecified: Secondary | ICD-10-CM | POA: Diagnosis not present

## 2022-01-20 DIAGNOSIS — Z87891 Personal history of nicotine dependence: Secondary | ICD-10-CM | POA: Insufficient documentation

## 2022-01-20 DIAGNOSIS — R1030 Lower abdominal pain, unspecified: Secondary | ICD-10-CM | POA: Insufficient documentation

## 2022-01-20 DIAGNOSIS — O26893 Other specified pregnancy related conditions, third trimester: Secondary | ICD-10-CM | POA: Diagnosis not present

## 2022-01-20 DIAGNOSIS — O26899 Other specified pregnancy related conditions, unspecified trimester: Secondary | ICD-10-CM | POA: Diagnosis present

## 2022-01-20 LAB — FETAL FIBRONECTIN: Fetal Fibronectin: NEGATIVE

## 2022-01-20 LAB — URINALYSIS, COMPLETE (UACMP) WITH MICROSCOPIC
Bilirubin Urine: NEGATIVE
Glucose, UA: NEGATIVE mg/dL
Hgb urine dipstick: NEGATIVE
Ketones, ur: NEGATIVE mg/dL
Nitrite: NEGATIVE
Protein, ur: NEGATIVE mg/dL
Specific Gravity, Urine: 1.017 (ref 1.005–1.030)
pH: 6 (ref 5.0–8.0)

## 2022-01-20 LAB — WET PREP, GENITAL
Clue Cells Wet Prep HPF POC: NONE SEEN
Sperm: NONE SEEN
Trich, Wet Prep: NONE SEEN
WBC, Wet Prep HPF POC: <10 — AB
Yeast Wet Prep HPF POC: NONE SEEN

## 2022-01-20 LAB — CHLAMYDIA/NGC RT PCR (ARMC ONLY)
Chlamydia Tr: NOT DETECTED
N gonorrhoeae: NOT DETECTED

## 2022-01-20 MED ORDER — CALCIUM CARBONATE ANTACID 500 MG PO CHEW: 2.0000 | CHEWABLE_TABLET | ORAL | Status: DC | PRN

## 2022-01-20 MED ORDER — ACETAMINOPHEN 500 MG PO TABS: 1000.0000 mg | ORAL_TABLET | Freq: Four times a day (QID) | ORAL | Status: DC | PRN

## 2022-01-20 NOTE — OB Triage Note (Signed)
Pt is G3P2 at 32.4 weeks with c/o UC x 2-3 days. She states they checked her cervix about 2 weeks ago and she has had UCs since then. + FM FHR 135

## 2022-01-20 NOTE — Discharge Summary (Signed)
Carrie Frazier is a 27 y.o. female. She is at [redacted]w[redacted]d gestation. Patient's last menstrual period was 06/06/2021 (exact date). 03/13/2022, Date entered prior to episode creation   Prenatal care site: [facility]  Chief complaint: lower back pain and cramping  HPI: Carrie Frazier presents to L&D with complaints of lower abdominal cramping and back pain.  Reports she had her cervix checked about 2 weeks ago (01/14/2022) and she's had symptoms since then.  Dx with BV at that time and has not picked up antibiotics.  Denies vaginal bleeding or recent intercourse.  Denies LOF.  Endorses good fetal movement.   Factors complicating pregnancy: History of substance use - amphetamines and marijuana, UDS pos for [facility] 08/23/2021 Hx of Pubic symphysis separation  History of depression  Nicotine dependence  GBS Pos Abnormal pap smear - ASCUS, hrHPV pos, did not do Colpo, needs postpartum  S: Resting comfortably. no CTX, no VB.no LOF,  Active fetal movement.   Maternal Medical History:  Past Medical Hx:  has a past medical history of Anemia affecting pregnancy, Depression, Hemoglobin C trait ([place]), and History of chlamydia.    Past Surgical Hx:  has a past surgical history that includes No past surgeries.   Allergies  Allergen Reactions   Amoxicillin Hives     Prior to Admission medications   Medication Sig Start Date End Date Taking? Authorizing Provider  metroNIDAZOLE (FLAGYL) 500 MG tablet Take 1 tablet by mouth 2 (two) times daily. 01/14/22 01/21/22 Yes [provider]  Doxylamine-Pyridoxine (DICLEGIS) 10-10 MG TBEC Take two tablets at bedtime on day 1 and 2; if symptoms persist, take 1 tab in AM and 2 tabs at bedtime day 3, then 1 tab every 6 hr PRN Patient not taking: Reported on 12/03/2021 07/08/21   Duffy Bruce, MD  Prenatal Vit-Fe Fumarate-FA (PRENATAL MULTIVITAMIN) TABS tablet Take 1 tablet by mouth daily at 12 noon.    [provider]  etonogestrel-ethinyl estradiol  (NUVARING) 0.12-0.015 MG/24HR vaginal ring Insert vaginally and leave in place for 3 consecutive weeks, then remove for 1 week. Patient not taking: Reported on 03/10/2019 01/25/19 07/28/19  Jerene Dilling, PA    Social History: She  reports that she quit smoking about 3 years ago. Her smoking use included cigarettes, cigars, and e-cigarettes. She has a 0.50 pack-year smoking history. She has never used smokeless tobacco. She reports that she does not currently use alcohol after a past usage of about 1.0 standard drink of alcohol per week. She reports that she does not currently use drugs after having used the following drugs: Marijuana.  Family History: family history includes Diabetes in her maternal grandmother; Hypertension in her maternal grandmother; Other in her brother.   Review of Systems: A full review of systems was performed and negative except as noted in the HPI.     Pertinent Results:  Prenatal Labs: Blood type/Rh B POS   Antibody screen Negative    Rubella Immune    Varicella Immune  RPR NR    HBsAg NR   Hep C NR   HIV NR    GC neg  Chlamydia neg  Genetic screening cfDNA negative   1 hour GTT Not completed   3 hour GTT N/A   GBS Unknown       O:  Temp 98.2 F (36.8 C) (Oral)   Resp 16   Ht 5' 3.5" (1.613 m)   Wt 83.9 kg   LMP 06/06/2021 (Exact Date)   BMI 32.26 kg/m  Results for orders placed or performed during the hospital encounter of 01/20/22 (from the past 48 hour(s))  Urinalysis, Complete w Microscopic Urine, Clean Catch   Collection Time: 01/20/22  3:25 AM  Result Value Ref Range   Color, Urine YELLOW (A) YELLOW   APPearance HAZY (A) CLEAR   Specific Gravity, Urine 1.017 1.005 - 1.030   pH 6.0 5.0 - 8.0   Glucose, UA NEGATIVE NEGATIVE mg/dL   Hgb urine dipstick NEGATIVE NEGATIVE   Bilirubin Urine NEGATIVE NEGATIVE   Ketones, ur NEGATIVE NEGATIVE mg/dL   Protein, ur NEGATIVE NEGATIVE mg/dL   Nitrite NEGATIVE NEGATIVE   Leukocytes,Ua SMALL (A)  NEGATIVE   RBC / HPF 0-5 0 - 5 RBC/hpf   WBC, UA 11-20 0 - 5 WBC/hpf   Bacteria, UA RARE (A) NONE SEEN   Squamous Epithelial / HPF 11-20 0 - 5 /HPF   Mucus PRESENT   Wet prep, genital   Collection Time: 01/20/22  3:38 AM   Specimen: Urine, Clean Catch  Result Value Ref Range   Yeast Wet Prep HPF POC NONE SEEN NONE SEEN   Trich, Wet Prep NONE SEEN NONE SEEN   Clue Cells Wet Prep HPF POC NONE SEEN NONE SEEN   WBC, Wet Prep HPF POC <10 (A) <10   Sperm NONE SEEN      Constitutional: NAD, AAOx3  HE/ENT: extraocular movements grossly intact, moist mucous membranes CV: RRR PULM: nl respiratory effort Abd: gravid, non-tender, non-distended, soft  Ext: Non-tender, Nonedmeatous Psych: mood appropriate, speech normal Pelvic : moderate amount of physiologic discharge SVE: Dilation: Fingertip Effacement (%): Thick Exam by:: MackieCNM    NST: Baseline FHR: 135 beats/min Variability: moderate Accelerations: present Decelerations: absent Tocometry: Occasional, mild contractions   Interpretation:  INDICATIONS: rule out uterine contractions RESULTS:  A NST procedure was performed with FHR monitoring and a normal baseline established, appropriate time of 20-40 minutes of evaluation, and accels >2 seen w 15x15 characteristics.  Results show a REACTIVE NST.   Assessment: 27 y.o. [id number] [redacted]w[redacted]d 03/13/2022, Date entered prior to episode creation   Principle diagnosis: Abdominal cramping affecting pregnancy [O26.899, R10.9]   Plan: 1) Reactive NST  -Category 1 tracing  -Reassuring fetal status   2) Labor: Not present  -Wet prep negative - previously positive for bacterial vaginosis.  She did not pick up her antibiotics.   -FFN negative -Discussed warning signs to return to L&D triage with  -Reviewed comfort measures   3) Disposition: discharge home stable -Precautions reviewed  -Follow up as scheduled with [facility] -Needs to schedule 1 hour glucose test for diabetes  screen in pregnancy    ----- Drinda Butts, CNM Certified Nurse Midwife [facility]

## 2022-02-13 LAB — OB RESULTS CONSOLE GC/CHLAMYDIA
Chlamydia: NEGATIVE
Neisseria Gonorrhea: NEGATIVE

## 2022-02-19 ENCOUNTER — Observation Stay
Admission: EM | Admit: 2022-02-19 | Discharge: 2022-02-19 | Disposition: A | Discharge status: Home / Self Care | Payer: Medicaid Other | Attending: Obstetrics and Gynecology | Admitting: Obstetrics and Gynecology

## 2022-02-19 ENCOUNTER — Other Ambulatory Visit: Payer: Self-pay

## 2022-02-19 ENCOUNTER — Encounter: Payer: Self-pay | Admitting: Obstetrics and Gynecology

## 2022-02-19 DIAGNOSIS — M545 Low back pain, unspecified: Secondary | ICD-10-CM | POA: Insufficient documentation

## 2022-02-19 DIAGNOSIS — Z3A36 36 weeks gestation of pregnancy: Secondary | ICD-10-CM | POA: Insufficient documentation

## 2022-02-19 DIAGNOSIS — Z79899 Other long term (current) drug therapy: Secondary | ICD-10-CM | POA: Diagnosis not present

## 2022-02-19 DIAGNOSIS — O26893 Other specified pregnancy related conditions, third trimester: Secondary | ICD-10-CM | POA: Diagnosis present

## 2022-02-19 DIAGNOSIS — O2693 Pregnancy related conditions, unspecified, third trimester: Principal | ICD-10-CM | POA: Insufficient documentation

## 2022-02-19 LAB — URINE DRUG SCREEN, QUALITATIVE (ARMC ONLY)
Amphetamines, Ur Screen: NOT DETECTED
Barbiturates, Ur Screen: NOT DETECTED
Benzodiazepine, Ur Scrn: NOT DETECTED
Cannabinoid 50 Ng, Ur ~~LOC~~: NOT DETECTED
Cocaine Metabolite,Ur ~~LOC~~: NOT DETECTED
MDMA (Ecstasy)Ur Screen: NOT DETECTED
Methadone Scn, Ur: NOT DETECTED
Opiate, Ur Screen: NOT DETECTED
Phencyclidine (PCP) Ur S: NOT DETECTED
Tricyclic, Ur Screen: NOT DETECTED

## 2022-02-19 MED ORDER — CALCIUM CARBONATE ANTACID 500 MG PO CHEW: 2.0000 | CHEWABLE_TABLET | ORAL | Status: DC | PRN

## 2022-02-19 MED ORDER — ACETAMINOPHEN 325 MG PO TABS: 650.0000 mg | ORAL_TABLET | ORAL | Status: DC | PRN

## 2022-02-19 MED ORDER — LACTATED RINGERS IV SOLN: 125.0000 mL/h | INTRAVENOUS | Status: DC

## 2022-02-19 MED ORDER — PRENATAL MULTIVITAMIN CH: 1.0000 | ORAL_TABLET | Freq: Every day | ORAL | Status: DC

## 2022-02-19 MED ORDER — ACETAMINOPHEN 500 MG PO TABS
1000.0000 mg | ORAL_TABLET | Freq: Four times a day (QID) | ORAL | Status: DC | PRN
  Administered 2022-02-19: 1000 mg via ORAL
  Filled 2022-02-19: qty 2

## 2022-02-19 MED ORDER — ZOLPIDEM TARTRATE 5 MG PO TABS: 5.0000 mg | ORAL_TABLET | Freq: Every evening | ORAL | Status: DC | PRN

## 2022-02-19 MED ORDER — DOCUSATE SODIUM 100 MG PO CAPS: 100.0000 mg | ORAL_CAPSULE | Freq: Every day | ORAL | Status: DC

## 2022-02-19 NOTE — OB Triage Note (Signed)
Pt G3P2 [redacted]w[redacted]d presents for ctx over the last 3 days. Pt reports around 10am this morning ctx were every 10 minutes. Pt rates intermittent lower back pain that radiates to her lower abd 8/10. +FM. Denies LOF, bleeding. VSS.

## 2022-02-19 NOTE — Discharge Summary (Signed)
Carrie Frazier is a 27 y.o. female. She is at 31w6dgestation. Patient's last menstrual period was 06/06/2021 (exact date). Estimated Date of Delivery: 03/13/22  [facility]  Chief complaint: back pain  HPI: Carrie Frazier presents to L&D with complaints of low back pain and contractions for the past 3 days, but denies contractions today.  Factors complicating pregnancy: Hx of substance abuse, marijuana and amphetamines Hx of pubic symphysis separation Hx of depression GBS pos Abnormal Pap smear ASCUS HPV pos  S: Resting comfortably. no CTX, no VB.no LOF,  Active fetal movement.   Maternal Medical History:  Past Medical Hx:  has a past medical history of Anemia affecting pregnancy, Depression, Hemoglobin C trait ([place], History of chlamydia, and Trichomonas infection 01/03/21 (01/03/2021).    Past Surgical Hx:  has a past surgical history that includes No past surgeries.   Allergies  Allergen Reactions   Amoxicillin Hives     Prior to Admission medications   Medication Sig Start Date End Date Taking? Authorizing Provider  Prenatal Vit-Fe Fumarate-FA (PRENATAL MULTIVITAMIN) TABS tablet Take 1 tablet by mouth daily at 12 noon.   Yes [provider]  Doxylamine-Pyridoxine (DICLEGIS) 10-10 MG TBEC Take two tablets at bedtime on day 1 and 2; if symptoms persist, take 1 tab in AM and 2 tabs at bedtime day 3, then 1 tab every 6 hr PRN Patient not taking: Reported on 12/03/2021 07/08/21   IDuffy Bruce MD  etonogestrel-ethinyl estradiol (NUVARING) 0.12-0.015 MG/24HR vaginal ring Insert vaginally and leave in place for 3 consecutive weeks, then remove for 1 week. Patient not taking: Reported on 03/10/2019 01/25/19 07/28/19  HJerene Dilling PA    Social History: She  reports that she quit smoking about 3 years ago. Her smoking use included cigarettes, cigars, and e-cigarettes. She has a 0.50 pack-year smoking history. She has never used smokeless  tobacco. She reports that she does not currently use alcohol after a past usage of about 1.0 standard drink of alcohol per week. She reports that she does not currently use drugs after having used the following drugs: Marijuana.  Family History: family history includes Diabetes in her maternal grandmother; Hypertension in her maternal grandmother; Other in her brother. ,no history of gyn cancers  Review of Systems: A full review of systems was performed and negative except as noted in the HPI.    O:  BP (!) 112/55 (BP Location: Right Arm)   Pulse 80   Temp 98 F (36.7 C) (Oral)   Resp 18   Ht 5' 3.5" (1.613 m)   Wt 88.5 kg   LMP 06/06/2021 (Exact Date)   BMI 34.00 kg/m  No results found for this or any previous visit (from the past 440hour(s)).   Constitutional: NAD, AAOx3  HE/ENT: extraocular movements grossly intact, moist mucous membranes CV: RRR PULM: nl respiratory effort, CTABL Abd: gravid, non-tender, non-distended, soft  Ext: Non-tender, Nonedmeatous Psych: mood appropriate, speech normal Pelvic : deferred SVE: Dilation: Fingertip Effacement (%): Thick Presentation: Vertex Exam by:: DBobette MoCNM   Fetal Monitor: Baseline: 135 bpm Variability: moderate Accels: Present Decels: none Toco: none  Category: I   Assessment: 27y.o. 319w6dere for antenatal surveillance during pregnancy.  Principle diagnosis: low back pain in pregnancy There were no encounter diagnoses.   Plan: Labor: not present.  Fetal Wellbeing: Reassuring Cat 1 tracing. Reactive NST  Take tylenol 1000 mg prn q 6 hours D/c home stable, precautions reviewed, follow-up as scheduled.   -----  Avelino Leeds, CNM Certified Nurse Midwife [facility]

## 2022-03-11 ENCOUNTER — Other Ambulatory Visit: Payer: Self-pay | Admitting: Certified Nurse Midwife

## 2022-03-11 DIAGNOSIS — Z3490 Encounter for supervision of normal pregnancy, unspecified, unspecified trimester: Secondary | ICD-10-CM

## 2022-03-11 NOTE — Progress Notes (Signed)
[id number] at [redacted]w[redacted]d LMP of 06/06/21, c/w early [place]oreaat 648w3d Scheduled for induction of labor for elective at term on 03/16/22.   [facility] complicated by: 1. IOL scheduled for 03/16/22 @ 0500  2. Subchorionic Hemorrhage  ? 07/26/21: 2 Subchorionic hemorrhages seen:1)Rt of IUP=2.02 x 1.35 x 1.53cm    2)anterior to IUP=1.73 x 0.46 x 1.52cm ? Repeat in 4 weeks  ? 08/23/21 SCOlineen: Left of IUP=2.00 x 0.70 x 2.24cm.  ? 09/24/21: resolved  2.    Hx of substance abuse  Amphetamines and marijuana  [facility]  08/23/21: UDS positive for cannabis   3.    Hx of Pubic symphysis separation   4. H/o mental health diagnoses  Dx: Depression  Medications prior to pregnancy: none  Medical team following heNA:[id number] during pregnancy:none  Counseling:  none   5.   E-Smoking   6.    Group B Strep Positive  Positive at [facility] treated with Macrobid 09/06/21   Treat in Labor - Amoxicillin allergy - hives   Sensitive to Clindamycin    7. Abnormal PAP Smear at [facility]  Pap 8/23: Ascus HPV Positive  Colpo scheduled for 09/30/21 with TJS - no-showed appointment Hx of umbilical cord avulsion at delivery with P2   Prenatal Labs: Blood type/Rh B positive  Antibody screen neg  Rubella Immune  Varicella Immune  RPR NR  HBsAg Neg  HIV NR  GC neg  Chlamydia neg  Genetic screening cfDNA negative   1 hour GTT Did not complete  3 hour GTT N/a  GBS POS   Tdap: declined Flu: declined Contraception: considering IUD or BTL Feeding preference: formula  ____ DaGertie FeyCNM  Certified Nurse Midwife [facility]

## 2022-03-16 ENCOUNTER — Encounter: Payer: Self-pay | Admitting: Obstetrics and Gynecology

## 2022-03-16 ENCOUNTER — Other Ambulatory Visit: Payer: Self-pay

## 2022-03-16 ENCOUNTER — Inpatient Hospital Stay: Payer: Medicaid Other | Admitting: Anesthesiology

## 2022-03-16 ENCOUNTER — Inpatient Hospital Stay
Admission: EM | Admit: 2022-03-16 | Discharge: 2022-03-18 | DRG: 807 | Disposition: A | Discharge status: Home / Self Care | Payer: Medicaid Other

## 2022-03-16 DIAGNOSIS — Z3490 Encounter for supervision of normal pregnancy, unspecified, unspecified trimester: Secondary | ICD-10-CM | POA: Diagnosis present

## 2022-03-16 DIAGNOSIS — Z3A40 40 weeks gestation of pregnancy: Secondary | ICD-10-CM

## 2022-03-16 DIAGNOSIS — O480 Post-term pregnancy: Principal | ICD-10-CM | POA: Diagnosis present

## 2022-03-16 DIAGNOSIS — O99824 Streptococcus B carrier state complicating childbirth: Secondary | ICD-10-CM | POA: Diagnosis present

## 2022-03-16 DIAGNOSIS — Z880 Allergy status to penicillin: Secondary | ICD-10-CM

## 2022-03-16 DIAGNOSIS — Z87891 Personal history of nicotine dependence: Secondary | ICD-10-CM

## 2022-03-16 LAB — URINE DRUG SCREEN, QUALITATIVE (ARMC ONLY)
Amphetamines, Ur Screen: NOT DETECTED
Barbiturates, Ur Screen: NOT DETECTED
Benzodiazepine, Ur Scrn: NOT DETECTED
Cannabinoid 50 Ng, Ur ~~LOC~~: NOT DETECTED
Cocaine Metabolite,Ur ~~LOC~~: NOT DETECTED
MDMA (Ecstasy)Ur Screen: NOT DETECTED
Methadone Scn, Ur: NOT DETECTED
Opiate, Ur Screen: NOT DETECTED
Phencyclidine (PCP) Ur S: NOT DETECTED
Tricyclic, Ur Screen: NOT DETECTED

## 2022-03-16 LAB — CBC
HCT: 31.2 % — ABNORMAL LOW (ref 36.0–46.0)
Hemoglobin: 10.6 g/dL — ABNORMAL LOW (ref 12.0–15.0)
MCH: 24.4 pg — ABNORMAL LOW (ref 26.0–34.0)
MCHC: 34 g/dL (ref 30.0–36.0)
MCV: 71.9 fL — ABNORMAL LOW (ref 80.0–100.0)
Platelets: 208 10*3/uL (ref 150–400)
RBC: 4.34 MIL/uL (ref 3.87–5.11)
RDW: 16.6 % — ABNORMAL HIGH (ref 11.5–15.5)
WBC: 9.2 10*3/uL (ref 4.0–10.5)
nRBC: 0 % (ref 0.0–0.2)

## 2022-03-16 LAB — TYPE AND SCREEN
ABO/RH(D): B POS
Antibody Screen: NEGATIVE

## 2022-03-16 LAB — SYPHILIS: RPR W/REFLEX TO RPR TITER AND TREPONEMAL ANTIBODIES, TRADITIONAL SCREENING AND DIAGNOSIS ALGORITHM: RPR Ser Ql: NONREACTIVE

## 2022-03-16 MED ORDER — AMMONIA AROMATIC IN INHA
RESPIRATORY_TRACT | Status: AC
  Filled 2022-03-16: qty 10

## 2022-03-16 MED ORDER — LACTATED RINGERS IV SOLN
500.0000 mL | INTRAVENOUS | Status: DC | PRN
  Administered 2022-03-16: 500 mL via INTRAVENOUS

## 2022-03-16 MED ORDER — CEFAZOLIN SODIUM-DEXTROSE 1-4 GM/50ML-% IV SOLN
1.0000 g | Freq: Three times a day (TID) | INTRAVENOUS | Status: DC
  Administered 2022-03-16 (×2): 1 g via INTRAVENOUS
  Filled 2022-03-16 (×4): qty 50

## 2022-03-16 MED ORDER — CALCIUM CARBONATE ANTACID 500 MG PO CHEW
200.0000 mg | CHEWABLE_TABLET | Freq: Three times a day (TID) | ORAL | Status: DC | PRN
  Administered 2022-03-16: 200 mg via ORAL
  Filled 2022-03-16: qty 1

## 2022-03-16 MED ORDER — OXYTOCIN-SODIUM CHLORIDE 30-0.9 UT/500ML-% IV SOLN
1.0000 m[IU]/min | INTRAVENOUS | Status: DC
  Administered 2022-03-16: 2 m[IU]/min via INTRAVENOUS
  Filled 2022-03-16: qty 500

## 2022-03-16 MED ORDER — PHENYLEPHRINE 80 MCG/ML (10ML) SYRINGE FOR IV PUSH (FOR BLOOD PRESSURE SUPPORT): 80.0000 ug | PREFILLED_SYRINGE | INTRAVENOUS | Status: DC | PRN

## 2022-03-16 MED ORDER — MISOPROSTOL 25 MCG QUARTER TABLET
25.0000 ug | ORAL_TABLET | ORAL | Status: DC
  Administered 2022-03-16 (×2): 25 ug via ORAL
  Filled 2022-03-16 (×2): qty 1

## 2022-03-16 MED ORDER — MISOPROSTOL 200 MCG PO TABS
ORAL_TABLET | ORAL | Status: AC
  Filled 2022-03-16: qty 4

## 2022-03-16 MED ORDER — FENTANYL-BUPIVACAINE-NACL 0.5-0.125-0.9 MG/250ML-% EP SOLN
12.0000 mL/h | EPIDURAL | Status: DC | PRN
  Administered 2022-03-16: 12 mL/h via EPIDURAL
  Filled 2022-03-16: qty 250

## 2022-03-16 MED ORDER — LIDOCAINE HCL (PF) 1 % IJ SOLN: 30.0000 mL | INTRAMUSCULAR | Status: DC | PRN

## 2022-03-16 MED ORDER — LIDOCAINE-EPINEPHRINE (PF) 1.5 %-1:200000 IJ SOLN
INTRAMUSCULAR | Status: DC | PRN
  Administered 2022-03-16: 3 mL via EPIDURAL

## 2022-03-16 MED ORDER — SOD CITRATE-CITRIC ACID 500-334 MG/5ML PO SOLN: 30.0000 mL | ORAL | Status: DC | PRN

## 2022-03-16 MED ORDER — ONDANSETRON HCL 4 MG/2ML IJ SOLN
4.0000 mg | Freq: Four times a day (QID) | INTRAMUSCULAR | Status: DC | PRN
  Administered 2022-03-17: 4 mg via INTRAVENOUS
  Filled 2022-03-16: qty 2

## 2022-03-16 MED ORDER — OXYTOCIN 10 UNIT/ML IJ SOLN
INTRAMUSCULAR | Status: AC
  Filled 2022-03-16: qty 2

## 2022-03-16 MED ORDER — DIPHENHYDRAMINE HCL 50 MG/ML IJ SOLN: 12.5000 mg | INTRAMUSCULAR | Status: DC | PRN

## 2022-03-16 MED ORDER — LACTATED RINGERS IV SOLN: INTRAVENOUS | Status: DC

## 2022-03-16 MED ORDER — MISOPROSTOL 25 MCG QUARTER TABLET
25.0000 ug | ORAL_TABLET | ORAL | Status: DC
  Administered 2022-03-16 (×2): 25 ug via VAGINAL
  Filled 2022-03-16 (×2): qty 1

## 2022-03-16 MED ORDER — FENTANYL CITRATE (PF) 100 MCG/2ML IJ SOLN: 50.0000 ug | INTRAMUSCULAR | Status: DC | PRN

## 2022-03-16 MED ORDER — LACTATED RINGERS IV SOLN
500.0000 mL | Freq: Once | INTRAVENOUS | Status: AC
  Administered 2022-03-16: 500 mL via INTRAVENOUS

## 2022-03-16 MED ORDER — LIDOCAINE HCL (PF) 1 % IJ SOLN
INTRAMUSCULAR | Status: AC
  Filled 2022-03-16: qty 30

## 2022-03-16 MED ORDER — SODIUM CHLORIDE 0.9 % IV SOLN
INTRAVENOUS | Status: DC | PRN
  Administered 2022-03-16 (×2): 5 mL via EPIDURAL

## 2022-03-16 MED ORDER — EPHEDRINE 5 MG/ML INJ: 10.0000 mg | INTRAVENOUS | Status: DC | PRN

## 2022-03-16 MED ORDER — CEFAZOLIN SODIUM-DEXTROSE 2-4 GM/100ML-% IV SOLN
2.0000 g | Freq: Once | INTRAVENOUS | Status: AC
  Administered 2022-03-16: 2 g via INTRAVENOUS
  Filled 2022-03-16: qty 100

## 2022-03-16 MED ORDER — LIDOCAINE HCL (PF) 1 % IJ SOLN
INTRAMUSCULAR | Status: DC | PRN
  Administered 2022-03-16: 3 mL via SUBCUTANEOUS

## 2022-03-16 MED ORDER — TERBUTALINE SULFATE 1 MG/ML IJ SOLN: 0.2500 mg | Freq: Once | INTRAMUSCULAR | Status: DC | PRN

## 2022-03-16 MED ORDER — OXYTOCIN BOLUS FROM INFUSION: 333.0000 mL | Freq: Once | INTRAVENOUS | Status: DC

## 2022-03-16 MED ORDER — OXYTOCIN-SODIUM CHLORIDE 30-0.9 UT/500ML-% IV SOLN
2.5000 [IU]/h | INTRAVENOUS | Status: DC
  Filled 2022-03-16: qty 500

## 2022-03-16 MED ORDER — ACETAMINOPHEN 325 MG PO TABS: 650.0000 mg | ORAL_TABLET | ORAL | Status: DC | PRN

## 2022-03-16 NOTE — Anesthesia Preprocedure Evaluation (Signed)
Anesthesia Evaluation  Patient identified by MRN, date of birth, ID band Patient awake    Reviewed: Allergy & Precautions, H&P , NPO status , Patient's Chart, lab work & pertinent test results  History of Anesthesia Complications Negative for: history of anesthetic complications  Airway Mallampati: II  TM Distance: >3 FB Neck ROM: full    Dental no notable dental hx.    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) No date: History of chlamydia 01/03/2021: Trichomonas infection 01/03/21   Reproductive/Obstetrics (+) Pregnancy                             Anesthesia Physical Anesthesia Plan  ASA: 2  Anesthesia Plan: Epidural   Post-op Pain Management:    Induction:   PONV Risk Score and Plan:   Airway Management Planned:   Additional Equipment:   Intra-op Plan:   Post-operative Plan:   Informed Consent: I have reviewed the patients History and Physical, chart, labs and discussed the procedure including the risks, benefits and alternatives for the proposed anesthesia with the patient or authorized representative who has indicated his/her understanding and acceptance.       Plan Discussed with: Anesthesiologist  Anesthesia Plan Comments:         Anesthesia Quick Evaluation

## 2022-03-16 NOTE — Progress Notes (Signed)
Labor Progress Note  Carrie Frazier is a 27 y.o. G3P2002 at 13w3dby LMP admitted for induction of labor due to Elective at term.  Subjective: she is more uncomfortable, reports her contractions are stronger and she feels pressure  Objective: BP 130/75 (BP Location: Left Arm)   Pulse 89   Temp 98 F (36.7 C) (Oral)   Resp 16   Ht '5\' 4"'$  (1.626 m)   Wt 90.7 kg   LMP 06/06/2021 (Exact Date)   SpO2 99%   BMI 34.33 kg/m  Notable VS details: reviewed  Fetal Assessment: FHT:  FHR: 145 bpm, variability: moderate,  accelerations:  Present,  decelerations:  Absent Category/reactivity:  Category I UC:   regular, every 2-3 minutes SVE:    Dilation: 3cm  Effacement: 70%  Station:  -1  Consistency: soft  Position: anterior  Membrane status:intact Amniotic color: n/a  Labs: Lab Results  Component Value Date   WBC 9.2 03/16/2022   HGB 10.6 (L) 03/16/2022   HCT 31.2 (L) 03/16/2022   MCV 71.9 (L) 03/16/2022   PLT 208 03/16/2022    Assessment / Plan: 27year old G3P2002 at 4106w3dere for IOL for elective at term  Labor:  Received 2 doses of cytotec, now can switch to pitocin Preeclampsia:  labs stable Fetal Wellbeing:  Category I Pain Control:  Labor support without medications I/D:   GBS positive, received two doses of Ancef Anticipated MOD:  NSVD  DaGertie FeyCNM 03/16/2022, 2:47 PM

## 2022-03-16 NOTE — Anesthesia Procedure Notes (Signed)
Epidural Patient location during procedure: OB Start time: 03/16/2022 7:04 PM End time: 03/16/2022 7:08 PM  Staffing Anesthesiologist: Martha Clan, MD Performed: anesthesiologist   Preanesthetic Checklist Completed: patient identified, IV checked, site marked, risks and benefits discussed, surgical consent, monitors and equipment checked, pre-op evaluation and timeout performed  Epidural Patient position: sitting Prep: ChloraPrep Patient monitoring: heart rate, continuous pulse ox and blood pressure Approach: midline Location: L3-L4 Injection technique: LOR saline  Needle:  Needle type: Tuohy  Needle gauge: 17 G Needle length: 9 cm and 9 Needle insertion depth: 7 cm Catheter type: closed end flexible Catheter size: 19 Gauge Catheter at skin depth: 12 cm Test dose: negative and 1.5% lidocaine with Epi 1:200 K  Assessment Sensory level: T10 Events: blood not aspirated, no cerebrospinal fluid, injection not painful, no injection resistance, no paresthesia and negative IV test  Additional Notes 1st attempt Pt. Evaluated and documentation done after procedure finished. Patient identified. Risks/Benefits/Options discussed with patient including but not limited to bleeding, infection, nerve damage, paralysis, failed block, incomplete pain control, headache, blood pressure changes, nausea, vomiting, reactions to medication both or allergic, itching and postpartum back pain. Confirmed with bedside nurse the patient's most recent platelet count. Confirmed with patient that they are not currently taking any anticoagulation, have any bleeding history or any family history of bleeding disorders. Patient expressed understanding and wished to proceed. All questions were answered. Sterile technique was used throughout the entire procedure. Please see nursing notes for vital signs. Test dose was given through epidural catheter and negative prior to continuing to dose epidural or start infusion.  Warning signs of high block given to the patient including shortness of breath, tingling/numbness in hands, complete motor block, or any concerning symptoms with instructions to call for help. Patient was given instructions on fall risk and not to get out of bed. All questions and concerns addressed with instructions to call with any issues or inadequate analgesia.    Patient tolerated the insertion well without immediate complications.Reason for block:procedure for pain

## 2022-03-16 NOTE — Progress Notes (Signed)
Labor Progress Note  Carrie Frazier is a 27 y.o. G3P2002 at 51w3dby LMP admitted for induction of labor due to Elective at term.  Subjective: she is comfortable after her epidural  Objective: BP 121/62   Pulse 88   Temp 98.3 F (36.8 C) (Oral)   Resp 16   Ht '5\' 4"'$  (1.626 m)   Wt 90.7 kg   LMP 06/06/2021 (Exact Date)   SpO2 98%   BMI 34.33 kg/m  Notable VS details: reviewed  Fetal Assessment: FHT:  FHR: 145 bpm, variability: moderate,  accelerations:  Present,  decelerations:  Absent Category/reactivity:  Category I UC:   regular, every 1.5-4 minutes SVE:    Dilation: 5cm  Effacement: 50%  Station:  -1  Consistency: soft  Position: anterior  Membrane status:AROM @ 1720 Amniotic color: clear  Labs: Lab Results  Component Value Date   WBC 9.2 03/16/2022   HGB 10.6 (L) 03/16/2022   HCT 31.2 (L) 03/16/2022   MCV 71.9 (L) 03/16/2022   PLT 208 03/16/2022    Assessment / Plan: Induction of labor due to elective at term,  progressing well on pitocin  Labor:  Progressing normally, pitocin at 896mmin Preeclampsia:  labs stable Fetal Wellbeing:  Category I Pain Control:  Epidural I/D:   GBS positive,  treated with Ancef x 2 Anticipated MOD:  NSVD  DaGertie FeyCNM 03/16/2022, 8:40 PM

## 2022-03-16 NOTE — H&P (Signed)
OB History & Physical   History of Present Illness:  Chief Complaint:   HPI:  Carrie Frazier is a 27 y.o. G35P2002 female at 63w3ddated by LMP.  She presents to L&D for  Scheduled IOL for elective at term  Good fetal movement, occasional contractions, denies leaking fluid.   Pregnancy Issues: 1. SMullan(resolved) 2. Hx of substance abuse (UDS positive for cannabis in pregnancy) 3. Hx of pubic symphysis dysfunction 4. Depression 5. Vaping 6. GBS Positive 7. Abnormal pap smear at [facility] 8. Hx of umbilical cord avulsion at delivery with second baby   Maternal Medical History:   Past Medical History:  Diagnosis Date   Anemia affecting pregnancy    Depression    Hemoglobin C trait ([place]    History of chlamydia    Trichomonas infection 01/03/21 01/03/2021    Past Surgical History:  Procedure Laterality Date   NO PAST SURGERIES      Allergies  Allergen Reactions   Amoxicillin Hives    Prior to Admission medications   Medication Sig Start Date End Date Taking? Authorizing Provider  Doxylamine-Pyridoxine (DICLEGIS) 10-10 MG TBEC Take two tablets at bedtime on day 1 and 2; if symptoms persist, take 1 tab in AM and 2 tabs at bedtime day 3, then 1 tab every 6 hr PRN Patient not taking: Reported on 12/03/2021 07/08/21   IDuffy Bruce MD  Prenatal Vit-Fe Fumarate-FA (PRENATAL MULTIVITAMIN) TABS tablet Take 1 tablet by mouth daily at 12 noon.    [provider]  etonogestrel-ethinyl estradiol (NUVARING) 0.12-0.015 MG/24HR vaginal ring Insert vaginally and leave in place for 3 consecutive weeks, then remove for 1 week. Patient not taking: Reported on 03/10/2019 01/25/19 07/28/19  HJerene Dilling PA    [facility] She  reports that she quit smoking about 3 years ago. Her smoking use included cigarettes, cigars, and e-cigarettes. She has a 0.50 pack-year smoking history. She has never used smokeless tobacco. She reports that  she does not currently use alcohol after a past usage of about 1.0 standard drink of alcohol per week. She reports that she does not currently use drugs after having used the following drugs: Marijuana.  Family History: family history includes Diabetes in her maternal grandmother; Hypertension in her maternal grandmother; Other in her brother.   Review of Systems: A full review of systems was performed and negative except as noted in the HPI.     Physical Exam:  Vital Signs: BP 138/76   Pulse (!) 101   Temp 98.5 F (36.9 C) (Oral)   Resp 19   Ht '5\' 4"'$  (1.626 m)   Wt 90.7 kg   LMP 06/06/2021 (Exact Date)   SpO2 99%   BMI 34.33 kg/m  General: no acute distress.  HEENT: normocephalic, atraumatic Heart: regular rate & rhythm.  No murmurs/rubs/gallops Lungs: clear to auscultation bilaterally, normal respiratory effort Abdomen: soft, gravid, non-tender;  EFW: 7.5lb Pelvic:   External: Normal external female genitalia  Cervix: Dilation: 2 / Effacement (%): 50 / Station: -2    Extremities: non-tender, symmetric, no edema bilaterally.  DTRs: +2  Neurologic: Alert & oriented x 3.    Results for orders placed or performed during the hospital encounter of 03/16/22 (from the past 24 hour(s))  CBC     Status: Abnormal   Collection Time: 03/16/22  5:57 AM  Result Value Ref Range   WBC 9.2 4.0 - 10.5 K/uL   RBC 4.34  3.87 - 5.11 MIL/uL   Hemoglobin 10.6 (L) 12.0 - 15.0 g/dL   HCT 31.2 (L) 36.0 - 46.0 %   MCV 71.9 (L) 80.0 - 100.0 fL   MCH 24.4 (L) 26.0 - 34.0 pg   MCHC 34.0 30.0 - 36.0 g/dL   RDW 16.6 (H) 11.5 - 15.5 %   Platelets 208 150 - 400 K/uL   nRBC 0.0 0.0 - 0.2 %  Type and screen     Status: None   Collection Time: 03/16/22  5:57 AM  Result Value Ref Range   ABO/RH(D) B POS    Antibody Screen NEG    Sample Expiration      03/19/2022,2359 Performed at [facility], [address] [place], [place] [address]   Urine Drug Screen, Qualitative ([place] only)      Status: None   Collection Time: 03/16/22  5:57 AM  Result Value Ref Range   Tricyclic, Ur Screen NONE DETECTED NONE DETECTED   Amphetamines, Ur Screen NONE DETECTED NONE DETECTED   MDMA (Ecstasy)Ur Screen NONE DETECTED NONE DETECTED   Cocaine Metabolite,Ur [place] NONE DETECTED NONE DETECTED   Opiate, Ur Screen NONE DETECTED NONE DETECTED   Phencyclidine (PCP) Ur S NONE DETECTED NONE DETECTED   Cannabinoid 50 Ng, Ur [place] NONE DETECTED NONE DETECTED   Barbiturates, Ur Screen NONE DETECTED NONE DETECTED   Benzodiazepine, Ur Scrn NONE DETECTED NONE DETECTED   Methadone Scn, Ur NONE DETECTED NONE DETECTED    Pertinent Results:  Prenatal Labs: Blood type/Rh B positive  Antibody screen neg  Rubella Immune  Varicella Immune  RPR NR  HBsAg Neg  HIV NR  GC neg  Chlamydia neg  Genetic screening cfDNA negative   1 hour GTT Did not complete  3 hour GTT N/a  GBS POS   FHT: 140bpm, moderate variability, accelerations present, no decelerations TOCO: contractions q2-69mn, palpate mild SVE:  Dilation: 2 / Effacement (%): 50 / Station: -2    Cephalic by leopolds  No results found.  Assessment:  Carrie NLaily Lessneris a 27y.o. GG57P2002female at 463w3dith IOL for elective at term.   Plan:  1. Admit to Labor & Delivery; consents reviewed and obtained  2. Fetal Well being  - Fetal Tracing: Category I tracing - Group B Streptococcus ppx indicated: yes, allergic to amoxicillin, treat with Ancef - Presentation: vertex confirmed by Leopolds   3. Routine OB: - Prenatal labs reviewed, as above - Rh positive - CBC, T&S, RPR on admit - Clear fluids, saline lock  4. Induction of Labor -  Contractions q2-7m47m external toco in place -  Pelvis proven to 3390g -  Plan for induction with cytotec, pitocin,  -  Plan for continuous fetal monitoring  -  Maternal pain control as desired; undecided about pain control - Anticipate vaginal delivery  5. Post Partum Planning: - Infant feeding:  formula - Contraception: IUD vs BTL - Tdap: declined - Flu: declined  DanGertie FeyNM 03/16/22 8:37 AM

## 2022-03-17 ENCOUNTER — Encounter: Payer: Self-pay | Admitting: Obstetrics and Gynecology

## 2022-03-17 LAB — CBC
HCT: 28 % — ABNORMAL LOW (ref 36.0–46.0)
Hemoglobin: 9.6 g/dL — ABNORMAL LOW (ref 12.0–15.0)
MCH: 24.6 pg — ABNORMAL LOW (ref 26.0–34.0)
MCHC: 34.3 g/dL (ref 30.0–36.0)
MCV: 71.8 fL — ABNORMAL LOW (ref 80.0–100.0)
Platelets: 176 10*3/uL (ref 150–400)
RBC: 3.9 MIL/uL (ref 3.87–5.11)
RDW: 16.5 % — ABNORMAL HIGH (ref 11.5–15.5)
WBC: 16.4 10*3/uL — ABNORMAL HIGH (ref 4.0–10.5)
nRBC: 0 % (ref 0.0–0.2)

## 2022-03-17 MED ORDER — WITCH HAZEL-GLYCERIN EX PADS
1.0000 | MEDICATED_PAD | CUTANEOUS | Status: DC | PRN
  Administered 2022-03-18: 1 via TOPICAL
  Filled 2022-03-17 (×3): qty 100

## 2022-03-17 MED ORDER — OXYCODONE HCL 5 MG PO TABS: 10.0000 mg | ORAL_TABLET | ORAL | Status: DC | PRN

## 2022-03-17 MED ORDER — IBUPROFEN 600 MG PO TABS
600.0000 mg | ORAL_TABLET | Freq: Four times a day (QID) | ORAL | Status: DC
  Administered 2022-03-17 – 2022-03-18 (×4): 600 mg via ORAL
  Filled 2022-03-17 (×4): qty 1

## 2022-03-17 MED ORDER — BENZOCAINE-MENTHOL 20-0.5 % EX AERO
1.0000 | INHALATION_SPRAY | CUTANEOUS | Status: DC | PRN
  Administered 2022-03-18: 1 via TOPICAL
  Filled 2022-03-17 (×3): qty 56

## 2022-03-17 MED ORDER — DIBUCAINE (PERIANAL) 1 % EX OINT
1.0000 | TOPICAL_OINTMENT | CUTANEOUS | Status: DC | PRN
  Administered 2022-03-18: 1 via RECTAL
  Filled 2022-03-17 (×2): qty 28

## 2022-03-17 MED ORDER — ONDANSETRON HCL 4 MG PO TABS: 4.0000 mg | ORAL_TABLET | ORAL | Status: DC | PRN

## 2022-03-17 MED ORDER — SENNOSIDES-DOCUSATE SODIUM 8.6-50 MG PO TABS
2.0000 | ORAL_TABLET | Freq: Every day | ORAL | Status: DC
  Administered 2022-03-18: 2 via ORAL
  Filled 2022-03-17: qty 2

## 2022-03-17 MED ORDER — TETANUS-DIPHTH-ACELL PERTUSSIS 5-2.5-18.5 LF-MCG/0.5 IM SUSY: 0.5000 mL | PREFILLED_SYRINGE | Freq: Once | INTRAMUSCULAR | Status: DC

## 2022-03-17 MED ORDER — SIMETHICONE 80 MG PO CHEW: 80.0000 mg | CHEWABLE_TABLET | ORAL | Status: DC | PRN

## 2022-03-17 MED ORDER — OXYCODONE HCL 5 MG PO TABS: 5.0000 mg | ORAL_TABLET | ORAL | Status: DC | PRN

## 2022-03-17 MED ORDER — ACETAMINOPHEN 325 MG PO TABS
650.0000 mg | ORAL_TABLET | ORAL | Status: DC | PRN
  Administered 2022-03-17: 650 mg via ORAL
  Filled 2022-03-17: qty 2

## 2022-03-17 MED ORDER — COCONUT OIL OIL
1.0000 | TOPICAL_OIL | Status: DC | PRN
  Administered 2022-03-18: 1 via TOPICAL
  Filled 2022-03-17 (×2): qty 15

## 2022-03-17 MED ORDER — FERROUS SULFATE 325 (65 FE) MG PO TABS
325.0000 mg | ORAL_TABLET | Freq: Two times a day (BID) | ORAL | Status: DC
  Administered 2022-03-17 – 2022-03-18 (×4): 325 mg via ORAL
  Filled 2022-03-17 (×4): qty 1

## 2022-03-17 MED ORDER — PRENATAL MULTIVITAMIN CH
1.0000 | ORAL_TABLET | Freq: Every day | ORAL | Status: DC
  Administered 2022-03-17 – 2022-03-18 (×2): 1 via ORAL
  Filled 2022-03-17 (×2): qty 1

## 2022-03-17 MED ORDER — ONDANSETRON HCL 4 MG/2ML IJ SOLN: 4.0000 mg | INTRAMUSCULAR | Status: DC | PRN

## 2022-03-17 MED ORDER — DIPHENHYDRAMINE HCL 25 MG PO CAPS: 25.0000 mg | ORAL_CAPSULE | Freq: Four times a day (QID) | ORAL | Status: DC | PRN

## 2022-03-17 NOTE — Lactation Note (Signed)
This note was copied from a baby's chart. Lactation Consultation Note  Patient Name: Carrie Frazier [id number] Date: 03/17/2022 Age:28 hours Reason for consult: Initial assessment;Term   Maternal Data This is mom's 3rd baby, SVD. Mom with history of depression, anemia, THC (+ UDS 08/23/21)negative on admission. Mom is an experienced breastfeeding mother.  On initial consult today baby noted to be actively sucking on a pacifier and becoming fussy while sucking the pacifier. Discussed with mom feeding cues and encouraged mom to offer baby to breastfeed. Recommended for the first few days until breastfeeding is established to avoid use of the pacifier.  Has patient been taught Hand Expression?: Yes Does the patient have breastfeeding experience prior to this delivery?: Yes How long did the patient breastfeed?: mom reports she breastfeed each of her previous children for 3 years  Feeding Mother's Current Feeding Choice: Breast Milk and Formula  Mother attempted to latch baby who was showing feeding cues. Baby latching and detaching crying. Recommended mom hand express some colostrum. Mom reports, "I don't have anything." After instructing mom on hand expression mom readily expressed some drops of colostrum. Mom offered baby back to the breast. Baby attempting to latch. Mom stopped attempt. Per mom baby not hungry now. Recommended mom calm baby and offer baby to breastfeed again. LC available if mom would like additional breastfeeding assistance.  Lactation Tools Discussed/Used  Mom does not have a pump for home use. Provided mom with a Harmony manual pump.  Interventions  Breastfeeding basics. Discussed cluster feeding.  Discharge Pump:  (Provided Harmony manual pump.)  Consult Status Consult Status: Follow-up Date: 03/18/22 Follow-up type: In-patient  Update provided to care nurse.  Jonna Shafin Pollio 03/17/2022, 5:14 PM

## 2022-03-17 NOTE — Progress Notes (Signed)
Labor Progress Note  Carrie Frazier is a 27 y.o. G3P2002 at 82w3dby LMP admitted for induction of labor due to Elective at term.  Subjective: assumed care, Carrie Frazier is comfortable with her epidural  Objective: BP 97/69   Pulse 88   Temp 98.9 F (37.2 C) (Oral)   Resp 19   Ht '5\' 4"'$  (1.626 m)   Wt 90.7 kg   LMP 06/06/2021 (Exact Date)   SpO2 100%   BMI 34.33 kg/m   Fetal Assessment: FHT:  FHR: 145 bpm, variability: moderate,  accelerations:  Abscent,  decelerations:  Absent Category/reactivity:  Category I UC:   regular, every 2-4 minutes SVE:    Dilation: 6cm  Effacement: 70%  Station:  -1  Consistency: soft  Position: anterior  Membrane status:AROM @ [id number] color: clear  Labs: Lab Results  Component Value Date   WBC 9.2 03/16/2022   HGB 10.6 (L) 03/16/2022   HCT 31.2 (L) 03/16/2022   MCV 71.9 (L) 03/16/2022   PLT 208 03/16/2022    Assessment / Plan: Induction of labor due to elective at term,  progressing well on pitocin 0651 Ancef 2g 1143 3/70/-1 1409 Ancef 1g 1453 Pitocin started [id number]'d clear fluid 1919 Epidural placed 2014 5/50/-1 2313 Ancef 1g 2320 6/70/-1 Pitocin currently at 166m  Labor:  Progressing normally Preeclampsia:   97/69 Fetal Wellbeing:  Category I Pain Control:  Epidural I/D:   Afebrile, GBS positive,  treated with Ancef x 3 doses Anticipated MOD:  NSVD  Jenifer E Amaris Delafuente, CNM 03/17/2022, 12:13 AM

## 2022-03-17 NOTE — Plan of Care (Signed)
Patient delivered and transferred to MB unit.

## 2022-03-17 NOTE — Discharge Summary (Signed)
Obstetrical Discharge Summary  Patient Name: Carrie Frazier DOB: January 21, 1995 MRN: [id number]  Date of Admission: 03/16/2022 Date of Delivery: 03/17/22 Delivered by: Linda Hedges, CNM  Date of Discharge: 03/18/2022  Primary OB: [facility] [id number] last menstrual period was 06/06/2021 (exact date). EDC Estimated Date of Delivery: 03/13/22 Gestational Age at Delivery: [redacted]w[redacted]d  Antepartum complications:  1. SRoss(resolved) 2. Hx of substance abuse (UDS positive for cannabis in pregnancy) 3. Hx of pubic symphysis dysfunction 4. Depression 5. Vaping 6. GBS Positive 7. Abnormal pap smear at NOB 8. 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    Discharge Diagnosis: Term Pregnancy Delivered      Induction: AROM, Pitocin, and Cytotec Complications: None Intrapartum complications/course: see delivery note Delivery Type: spontaneous vaginal delivery Anesthesia: epidural anesthesia Placenta: spontaneous To Pathology: No  Laceration: small superficial tear Episiotomy: none Newborn Data: Live born female  Birth Weight:  7lbs 13.8 oz APGAR: 9, 9  Newborn Delivery   Birth date/time: 03/17/2022 01:23:22 Delivery type: Vaginal, Spontaneous      Postpartum Procedures: none [place]:     03/17/2022    8:57 AM  EFlavia ShipperPostnatal Depression Scale Screening Tool  I have been able to laugh and see the funny side of things. 0  I have looked forward with enjoyment to things. 0  I have  blamed myself unnecessarily when things went wrong. 0  I have been anxious or worried for no good reason. 0  I have felt scared or panicky for no good reason. 0  Things have been getting on top of me. 0  I have been so unhappy that I have had difficulty sleeping. 0  I have felt sad or miserable. 0  I have been so unhappy that I have been crying. 0  The thought of harming myself has occurred to me. 0  Edinburgh Postnatal Depression Scale Total 0     Post partum course:  Patient had an uncomplicated postpartum course.  By time of discharge on PPD#1, her pain was controlled on oral pain medications; she had appropriate lochia and was ambulating, voiding without difficulty and tolerating regular diet.  She was deemed stable for discharge to home.    Discharge Physical Exam:  BP (!) 112/53 (BP Location: Right Arm)   Pulse 71   Temp 97.6 F (36.4 C) (Oral)   Resp 18   Ht '5\' 4"'$  (1.626 m)   Wt 90.7 kg   LMP 06/06/2021 (Exact Date)   SpO2 98%   Breastfeeding Unknown   BMI 34.33 kg/m   General: NAD CV: RRR Pulm: CTABL, nl effort ABD: s/nd/nt, fundus firm and below the umbilicus Lochia: moderate Perineum: minimal edema/intact DVT Evaluation: LE non-ttp, no evidence of DVT on exam.  Hemoglobin  Date Value Ref Range Status  03/17/2022 9.6 (L) 12.0 - 15.0 g/dL Final   HCT  Date Value Ref Range Status  03/17/2022 28.0 (L) 36.0 - 46.0 % Final    Risk assessment for postpartum VTE and prophylactic treatment: Very high risk factors: None High  risk factors: None Moderate risk factors: Tobacco use > 1/2 PPD and BMI 30-40 kg/m2  Postpartum VTE prophylaxis with LMWH not indicated   Disposition: stable, discharge to home. Baby Feeding: breast feeding Baby Disposition: home with mom  Rh Immune globulin indicated: No Rubella vaccine given: was not indicated Varivax vaccine given: was not indicated Flu vaccine given in AP setting: No Tdap vaccine given in AP setting:  No  Contraception: Liletta IUD   Prenatal Labs:  Blood type/Rh B positive  Antibody screen neg  Rubella Immune  Varicella Immune  RPR NR  HBsAg Neg  HIV NR  GC neg  Chlamydia neg  Genetic screening cfDNA negative   1 hour GTT Did not complete  3 hour GTT N/a  GBS POS    Plan:  Carrie Frazier was discharged to home in good condition.  Discharge Medications: Allergies as of 03/18/2022       Reactions   Amoxicillin Hives        Medication List     STOP taking these medications    Doxylamine-Pyridoxine 10-10 MG Tbec Commonly known as: Diclegis       TAKE these medications    acetaminophen 325 MG tablet Commonly known as: Tylenol Take 2 tablets (650 mg total) by mouth every 4 (four) hours as needed (for pain scale < 4).   ibuprofen 600 MG tablet Commonly known as: ADVIL Take 1 tablet (600 mg total) by mouth every 6 (six) hours as needed for mild pain or cramping.   prenatal multivitamin Tabs tablet Take 1 tablet by mouth daily at 12 noon.         Follow-up Information     Linda Hedges, CNM. Schedule an appointment as soon as possible for a visit in 2 week(s).   Specialty: Certified Nurse Midwife Why: for a mood check Contact information: [place] [address] [phone]         Linda Hedges, [place]. Schedule an appointment as soon as possible for a visit in 6 week(s).   Specialty: Certified Nurse Midwife Why: postpartum visit and IUD insertion Contact information: [place] [address] [phone]                 Signed:  Drinda Butts, CNM Certified Nurse Midwife [facility]

## 2022-03-17 NOTE — Progress Notes (Signed)
Pt reported to RN that "I need to leave at 24 hours"; RN educated pt about 24 hour screens on the baby and that the PKU can't be done prior to baby turning 51 hours old; RN also educated pt that the birth certificate and [place] questions need to be completed prior to discharge; pt said "my mom and sister have missed work watching my kids, they can't miss work can my 27 year old come up"; RN educated pt about visitation policy; Therapist, sports had [facility] (k. Cevallos, RN) go into room to discuss discharge process for her and her baby and visitation policy; pt said "ok"

## 2022-03-18 ENCOUNTER — Encounter: Payer: Self-pay | Admitting: Obstetrics and Gynecology

## 2022-03-18 MED ORDER — IBUPROFEN 600 MG PO TABS
600.0000 mg | ORAL_TABLET | Freq: Four times a day (QID) | ORAL | Status: DC
  Administered 2022-03-18 (×2): 600 mg via ORAL
  Filled 2022-03-18 (×2): qty 1

## 2022-03-18 MED ORDER — ACETAMINOPHEN 325 MG PO TABS: 650.0000 mg | ORAL_TABLET | ORAL | Status: AC | PRN

## 2022-03-18 MED ORDER — IBUPROFEN 600 MG PO TABS: 600.0000 mg | ORAL_TABLET | Freq: Four times a day (QID) | ORAL | 0 refills | Status: AC | PRN

## 2022-03-18 NOTE — Progress Notes (Signed)
Pt discharged with infant.  Discharge instructions, prescriptions and follow up appointment given to and reviewed with pt. Pt verbalized understanding. Escorted out by staff. 

## 2022-03-18 NOTE — Discharge Instructions (Signed)
Vaginal Delivery, Care After Refer to this sheet in the next few weeks. These discharge instructions provide you with information on caring for yourself after delivery. Your caregiver may also give you specific instructions. Your treatment has been planned according to the most current medical practices available, but problems sometimes occur. Call your caregiver if you have any problems or questions after you go home. HOME CARE INSTRUCTIONS Take over-the-counter or prescription medicines only as directed by your caregiver or pharmacist. Do not drink alcohol, especially if you are breastfeeding or taking medicine to relieve pain. Do not smoke tobacco. Continue to use good perineal care. Good perineal care includes: Wiping your perineum from back to front Keeping your perineum clean. You can do sitz baths twice a day, to help keep this area clean Do not use tampons, douche or have sex until your caregiver says it is okay. Shower only and avoid sitting in submerged water, aside from sitz baths Wear a well-fitting bra that provides breast support. Eat healthy foods. Drink enough fluids to keep your urine clear or pale yellow. Eat high-fiber foods such as whole grain cereals and breads, brown rice, beans, and fresh fruits and vegetables every day. These foods may help prevent or relieve constipation. Avoid constipation with high fiber foods or medications, such as miralax or metamucil Follow your caregiver's recommendations regarding resumption of activities such as climbing stairs, driving, lifting, exercising, or traveling. Talk to your caregiver about resuming sexual activities. Resumption of sexual activities is dependent upon your risk of infection, your rate of healing, and your comfort and desire to resume sexual activity. Try to have someone help you with your household activities and your newborn for at least a few days after you leave the hospital. Rest as much as possible. Try to rest or  take a nap when your newborn is sleeping. Increase your activities gradually. Keep all of your scheduled postpartum appointments. It is very important to keep your scheduled follow-up appointments. At these appointments, your caregiver will be checking to make sure that you are healing physically and emotionally. SEEK MEDICAL CARE IF:  You are passing large clots from your vagina. Save any clots to show your caregiver. You have a foul smelling discharge from your vagina. You have trouble urinating. You are urinating frequently. You have pain when you urinate. You have a change in your bowel movements. You have increasing redness, pain, or swelling near your vaginal incision (episiotomy) or vaginal tear. You have pus draining from your episiotomy or vaginal tear. Your episiotomy or vaginal tear is separating. You have painful, hard, or reddened breasts. You have a severe headache. You have blurred vision or see spots. You feel sad or depressed. You have thoughts of hurting yourself or your newborn. You have questions about your care, the care of your newborn, or medicines. You are dizzy or light-headed. You have a rash. You have nausea or vomiting. You were breastfeeding and have not had a menstrual period within 12 weeks after you stopped breastfeeding. You are not breastfeeding and have not had a menstrual period by the 12th week after delivery. You have a fever. SEEK IMMEDIATE MEDICAL CARE IF:  You have persistent pain. You have chest pain. You have shortness of breath. You faint. You have leg pain. You have stomach pain. Your vaginal bleeding saturates two or more sanitary pads in 1 hour. MAKE SURE YOU:  Understand these instructions. Will watch your condition. Will get help right away if you are not doing well or   get worse. Document Released: 12/21/1999 Document Revised: 05/09/2013 Document Reviewed: 08/20/2011 ExitCare Patient Information 2015 [employer]. This  information is not intended to replace advice given to you by your health care provider. Make sure you discuss any questions you have with your health care provider.  Sitz Bath A sitz bath is a warm water bath taken in the sitting position. The water covers only the hips and butt (buttocks). We recommend using one that fits in the toilet, to help with ease of use and cleanliness. It may be used for either healing or cleaning purposes. Sitz baths are also used to relieve pain, itching, or muscle tightening (spasms). The water may contain medicine. Moist heat will help you heal and relax.  HOME CARE  Take 3 to 4 sitz baths a day. Fill the bathtub half-full with warm water. Sit in the water and open the drain a little. Turn on the warm water to keep the tub half-full. Keep the water running constantly. Soak in the water for 15 to 20 minutes. After the sitz bath, pat the affected area dry. GET HELP RIGHT AWAY IF: You get worse instead of better. Stop the sitz baths if you get worse. MAKE SURE YOU: Understand these instructions. Will watch your condition. Will get help right away if you are not doing well or get worse. Document Released: 01/31/2004 Document Revised: 09/17/2011 Document Reviewed: 04/22/2010 ExitCare Patient Information 2015 [employer]. This information is not intended to replace advice given to you by your health care provider. Make sure you discuss any questions you have with your health care provider.     

## 2022-03-18 NOTE — Lactation Note (Signed)
This note was copied from a baby's chart. Lactation Consultation Note  Patient Name: Carrie Frazier [id number] Date: 03/18/2022 Age:28 hours Reason for consult: Follow-up assessment;Term   Maternal Data Has patient been taught Hand Expression?: Yes Does the patient have breastfeeding experience prior to this delivery?: Yes How long did the patient breastfeed?: 56yr  P3 mom ready for discharge. Notes all is well with feeding; no concerns.  Feeding Mother's Current Feeding Choice: Breast Milk  Baby has been BF well with adequate output along with passing 24 hour screens. Mom states that baby is getting better with latching and feeding each time, and she is comfortable with positioning, hand expression, and latching.  LATCH Score   Baby did not feed during this visit; sleeping soundly in the bassinet.   Lactation Tools Discussed/Used    Interventions Interventions: Breast feeding basics reviewed;Hand express;Pre-pump if needed;Hand pump;Education  Reviewed newborn feeding patterns and behaviors, cluster feeding and growth spurts. Encouraged 8 or more feedings every 24 hours, tracking output in 24 hour increments. Encouraged to hand express to get baby to open wide and latch deeply for optimal transfer.  Discharge Discharge Education: Engorgement and breast care;Warning signs for feeding baby;Outpatient recommendation Pump: Manual  Brief guidance given on management of breast fullness; mom stating she knew how to work th rough engorgement.  Consult Status Consult Status: Complete  Outpatient lactation number provided. Encouraged to call with questions and ongoing BF support.  SLavonia Drafts3/12/2022, 9:45 AM

## 2022-03-18 NOTE — Anesthesia Postprocedure Evaluation (Signed)
Anesthesia Post Note  Patient: Carrie Frazier  Procedure(s) Performed: AN AD Goodman  Patient location during evaluation: Mother Baby Anesthesia Type: Epidural Level of consciousness: awake and alert Pain management: pain level controlled Vital Signs Assessment: post-procedure vital signs reviewed and stable Respiratory status: spontaneous breathing, nonlabored ventilation and respiratory function stable Cardiovascular status: stable Postop Assessment: no headache, no backache and epidural receding Anesthetic complications: no   No notable events documented.   Last Vitals:  Vitals:   03/17/22 1544 03/18/22 0004  BP: 124/79 122/65  Pulse: 71 66  Resp: 18 20  Temp: 36.7 C 36.8 C  SpO2: 100% 99%    Last Pain:  Vitals:   03/17/22 1955  TempSrc:   PainSc: [place]

## 2022-06-20 ENCOUNTER — Ambulatory Visit: Payer: Medicaid Other | Admitting: Family Medicine

## 2022-06-20 ENCOUNTER — Ambulatory Visit: Payer: Medicaid Other

## 2023-03-04 ENCOUNTER — Emergency Department: Payer: Medicaid Other

## 2023-03-04 ENCOUNTER — Emergency Department
Admission: EM | Admit: 2023-03-04 | Discharge: 2023-03-04 | Disposition: A | Discharge status: Home / Self Care | Payer: Medicaid Other | Attending: Emergency Medicine | Admitting: Emergency Medicine

## 2023-03-04 ENCOUNTER — Other Ambulatory Visit: Payer: Self-pay

## 2023-03-04 ENCOUNTER — Encounter: Payer: Self-pay | Admitting: Emergency Medicine

## 2023-03-04 DIAGNOSIS — R519 Headache, unspecified: Secondary | ICD-10-CM | POA: Diagnosis present

## 2023-03-04 DIAGNOSIS — M542 Cervicalgia: Secondary | ICD-10-CM | POA: Diagnosis not present

## 2023-03-04 DIAGNOSIS — M7918 Myalgia, other site: Secondary | ICD-10-CM

## 2023-03-04 DIAGNOSIS — Y92410 Unspecified street and highway as the place of occurrence of the external cause: Secondary | ICD-10-CM | POA: Diagnosis not present

## 2023-03-04 DIAGNOSIS — M25511 Pain in right shoulder: Secondary | ICD-10-CM | POA: Diagnosis not present

## 2023-03-04 MED ORDER — CYCLOBENZAPRINE HCL 5 MG PO TABS: 5.0000 mg | ORAL_TABLET | Freq: Three times a day (TID) | ORAL | 0 refills | Status: DC | PRN
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
## 2023-03-04 NOTE — ED Notes (Signed)
 Pt verbalizes understanding of discharge instructions. Opportunity for questioning and answers were provided. Pt discharged from ED to home with family. Declined discharge vital signs
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
## 2023-03-12 ENCOUNTER — Ambulatory Visit

## 2023-03-12 ENCOUNTER — Encounter: Payer: Self-pay | Admitting: Physician Assistant

## 2023-03-12 DIAGNOSIS — Z113 Encounter for screening for infections with a predominantly sexual mode of transmission: Secondary | ICD-10-CM | POA: Diagnosis not present

## 2023-03-12 LAB — HM HIV SCREENING LAB: HM HIV Screening: NEGATIVE

## 2023-03-12 LAB — WET PREP FOR TRICH, YEAST, CLUE
Trichomonas Exam: NEGATIVE
Yeast Exam: NEGATIVE

## 2023-03-12 NOTE — Progress Notes (Signed)
 [facility] Department STI clinic [address], [address] [place] [address] Main phone: [phone]  STI screening visit  Subjective:  Carrie Frazier is a 28 y.o. female being seen today for an STI screening visit. The patient reports they do have symptoms.  Patient reports that they do not desire a pregnancy in the next year.   They reported they are not interested in discussing contraception today.    Patient's last menstrual period was 02/09/2023.  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 No known STI exposure.   Patient reports reports use of new "vaginal body wash my friend recommended."  Does the patient using douching products? No  Last HIV test per patient/review of record was  Lab Results  Component Value Date   HMHIVSCREEN Negative - Validated 05/14/2020    Lab Results  Component Value Date   HIV Non-reactive 08/26/2021     Last HEPC test per patient/review of record was No results found for: "HMHEPCSCREEN" No components found for: "HEPC"   Last HEPB test per patient/review of record was No components found for: "HMHEPBSCREEN"   Patient reports last pap was: not sure  No results found for: "DIAGPAP", "HPVHIGH", "ADEQPAP" No results found for: "SPECADGYN" Result Date Procedure Results Follow-ups  08/20/2017 HM PAP SMEAR HM Pap smear: Negative     Screening for MPX risk: Does the patient have an unexplained rash? No Is the patient MSM? No Does the patient endorse multiple sex partners or anonymous sex partners? No Did the patient have close or sexual contact with a person diagnosed with MPX? No Has the patient traveled outside the [place] where MPX is endemic? No Is  there a high clinical suspicion for MPX-- evidenced by one of the following No  -Unlikely to be chickenpox  -Lymphadenopathy  -Rash that present in same phase of evolution on any given body part See flowsheet for further details and programmatic requirements.   Immunization history:  Immunization History  Administered Date(s) Administered   Hepatitis A 06/13/2010   Hepatitis B August 07, 1994, 01/17/1995, 08/07/1995   Tdap 06/10/2007     The following portions of the patient's history were reviewed and updated as appropriate: allergies, current medications, past medical history, past social history, past surgical history and problem list.  Objective:  There were no vitals filed for this visit.  Physical Exam Vitals and nursing note reviewed.  Constitutional:      Appearance: Normal appearance.  HENT:     Head: Normocephalic and atraumatic.     Mouth/Throat:     Mouth: Mucous membranes are moist.     Pharynx: Oropharynx is clear. No oropharyngeal exudate or posterior oropharyngeal erythema.  Pulmonary:     Effort: Pulmonary effort is normal.  Abdominal:     General: Abdomen is flat.     Palpations: There is no mass.     Tenderness: There is no abdominal tenderness. There is no rebound.  Genitourinary:    General: Normal vulva.     Exam position: Lithotomy position.     Pubic Area: No rash or pubic lice.      Labia:        Right: No rash or lesion.  Left: No rash or lesion.      Vagina: Normal. No vaginal discharge, erythema, bleeding or lesions.     Cervix: Discharge present. No cervical motion tenderness, friability, lesion or erythema.     Uterus: Normal.      Adnexa: Right adnexa normal and left adnexa normal.     Rectum: Normal.     Comments: pH = IUD string present protruding from cervical os, vag pH 6.0 Lymphadenopathy:     Head:     Right side of head: No preauricular or posterior auricular adenopathy.     Left side of head: No preauricular or posterior  auricular adenopathy.     Cervical: No cervical adenopathy.     Upper Body:     Right upper body: No supraclavicular, axillary or epitrochlear adenopathy.     Left upper body: No supraclavicular, axillary or epitrochlear adenopathy.     Lower Body: No right inguinal adenopathy. No left inguinal adenopathy.  Skin:    General: Skin is warm and dry.     Findings: No rash.  Neurological:     Mental Status: She is alert and oriented to person, place, and time.     Assessment and Plan:  [name] is a 28 y.o. female presenting to the [facility] Department for STI screening  1. Screening examination for venereal disease (Primary) Wet prep neg. Await remaining test results. - Chlamydia/Gonorrhea [place] - Syphilis Serology, [place] [facility] - HIV  LAB - WET PREP FOR TRICH, YEAST, CLUE   Patient accepted all screenings including vaginal CT/GC and bloodwork for HIV/RPR, and wet prep. Patient meets criteria for HepB screening? No. Ordered? no Patient meets criteria for HepC screening? No. Ordered? no  Treat wet prep per standing order Discussed time line for [facility] results and that patient will be called with positive results and encouraged patient to call if she had not heard in 2 weeks.  Counseled to return or seek care for continued or worsening symptoms Recommended repeat testing in 3 months with positive results. Recommended condom use with all sex for STI prevention.   Patient is currently using  IUS  to prevent pregnancy.    Return in about 6 months (around 09/12/2023) for STI screening.  No future appointments.  Landry Dyke, PA-C

## 2023-03-12 NOTE — Progress Notes (Signed)
 Reports has IUD in place and inserted in 2024 at [facility] (unsure of IUD type). Jossie Ng, RN Wet prep negative and no treatment indicated per standing order. Jossie Ng, RN

## 2023-06-09 ENCOUNTER — Emergency Department
Admission: EM | Admit: 2023-06-09 | Discharge: 2023-06-09 | Disposition: A | Discharge status: Home / Self Care | Attending: Emergency Medicine | Admitting: Emergency Medicine

## 2023-06-09 ENCOUNTER — Encounter: Payer: Self-pay | Admitting: Emergency Medicine

## 2023-06-09 ENCOUNTER — Other Ambulatory Visit: Payer: Self-pay

## 2023-06-09 DIAGNOSIS — D72829 Elevated white blood cell count, unspecified: Secondary | ICD-10-CM | POA: Diagnosis not present

## 2023-06-09 DIAGNOSIS — A5402 Gonococcal vulvovaginitis, unspecified: Secondary | ICD-10-CM | POA: Diagnosis not present

## 2023-06-09 DIAGNOSIS — A549 Gonococcal infection, unspecified: Secondary | ICD-10-CM

## 2023-06-09 DIAGNOSIS — B9689 Other specified bacterial agents as the cause of diseases classified elsewhere: Secondary | ICD-10-CM

## 2023-06-09 DIAGNOSIS — R102 Pelvic and perineal pain: Secondary | ICD-10-CM | POA: Diagnosis present

## 2023-06-09 LAB — CHLAMYDIA/NGC RT PCR (ARMC ONLY)
Chlamydia Tr: NOT DETECTED
N gonorrhoeae: DETECTED — AB

## 2023-06-09 LAB — CBC
HCT: 38.1 % (ref 36.0–46.0)
Hemoglobin: 13.4 g/dL (ref 12.0–15.0)
MCH: 26.3 pg (ref 26.0–34.0)
MCHC: 35.2 g/dL (ref 30.0–36.0)
MCV: 74.7 fL — ABNORMAL LOW (ref 80.0–100.0)
Platelets: 303 10*3/uL (ref 150–400)
RBC: 5.1 MIL/uL (ref 3.87–5.11)
RDW: 14.7 % (ref 11.5–15.5)
WBC: 13.1 10*3/uL — ABNORMAL HIGH (ref 4.0–10.5)
nRBC: 0 % (ref 0.0–0.2)

## 2023-06-09 LAB — COMPREHENSIVE METABOLIC PANEL WITH GFR
ALT: 14 U/L (ref 0–44)
AST: 12 U/L — ABNORMAL LOW (ref 15–41)
Albumin: 3.9 g/dL (ref 3.5–5.0)
Alkaline Phosphatase: 75 U/L (ref 38–126)
Anion gap: 10 (ref 5–15)
BUN: 15 mg/dL (ref 6–20)
CO2: 23 mmol/L (ref 22–32)
Calcium: 8.9 mg/dL (ref 8.9–10.3)
Chloride: 104 mmol/L (ref 98–111)
Creatinine, Ser: 0.81 mg/dL (ref 0.44–1.00)
GFR, Estimated: >60 mL/min (ref >60)
Glucose, Bld: 92 mg/dL (ref 70–99)
Potassium: 4 mmol/L (ref 3.5–5.1)
Sodium: 137 mmol/L (ref 135–145)
Total Bilirubin: 0.5 mg/dL (ref 0.0–1.2)
Total Protein: 7.9 g/dL (ref 6.5–8.1)

## 2023-06-09 LAB — WET PREP, GENITAL
Sperm: NONE SEEN
Trich, Wet Prep: NONE SEEN
WBC, Wet Prep HPF POC: >=10 — AB (ref <10)
Yeast Wet Prep HPF POC: NONE SEEN

## 2023-06-09 LAB — LIPASE, BLOOD: Lipase: 29 U/L (ref 11–51)

## 2023-06-09 MED ORDER — IBUPROFEN 600 MG PO TABS
600.0000 mg | ORAL_TABLET | Freq: Once | ORAL | Status: AC
  Administered 2023-06-09: 600 mg via ORAL
  Filled 2023-06-09: qty 1

## 2023-06-09 MED ORDER — AZITHROMYCIN 500 MG PO TABS
1000.0000 mg | ORAL_TABLET | Freq: Every day | ORAL | Status: DC
  Administered 2023-06-09: 1000 mg via ORAL
  Filled 2023-06-09: qty 2

## 2023-06-09 MED ORDER — DOXYCYCLINE HYCLATE 100 MG PO CAPS: 100.0000 mg | ORAL_CAPSULE | Freq: Two times a day (BID) | ORAL | 0 refills | Status: AC

## 2023-06-09 MED ORDER — CEFTRIAXONE SODIUM 1 G IJ SOLR
500.0000 mg | Freq: Once | INTRAMUSCULAR | Status: AC
  Administered 2023-06-09: 500 mg via INTRAMUSCULAR
  Filled 2023-06-09: qty 10

## 2023-06-09 NOTE — ED Notes (Signed)
 Discharge instructions reviewed with patient. Patient questions answered and opportunity for education reviewed. Patient voices understanding of discharge instructions with no further questions. Patient ambulatory with steady gait to lobby.

## 2023-06-09 NOTE — ED Triage Notes (Signed)
 Patient to ED via POV for lower abd pain/pelvic pain. States ongoing x2 months but now is painful to walk. Concerned her IUD moved. States she has been bleeding x2 months as well- states has blood clots.

## 2023-06-09 NOTE — ED Notes (Signed)
 27 yof with a c/c abdominal pain, vaginal pain, and not having a bowel movement for approx. 2 months. The pt is warm, pink, and dry. The pt is alert and oriented x 4. Call bell at the bedside.

## 2023-06-10 NOTE — ED Provider Notes (Signed)
 [facility] Provider Note    Event Date/Time   First MD Initiated Contact with Patient 06/09/23 [phone]     (approximate)   History   Abdominal Pain   HPI  Carrie Frazier is a 28 y.o. female with history of hemoglobin C trait, anemia and as listed in EMR presents to the emergency department for treatment and evaluation of lower abdominal and pelvic pain for the past 2 months but over the past few days it has been painful to walk.  She is concerned that her IUD has moved out of place.  She also states that she has had vaginal bleeding for the past 2 months and has been passing some clots..      Physical Exam   Triage Vital Signs: ED Triage Vitals  Encounter Vitals Group     BP 06/09/23 0818 (!) 124/111     Systolic BP Percentile --      Diastolic BP Percentile --      Pulse Rate 06/09/23 0818 94     Resp 06/09/23 0818 17     Temp 06/09/23 0818 98.4 F (36.9 C)     Temp Source 06/09/23 0818 Oral     SpO2 06/09/23 0818 100 %     Weight 06/09/23 0816 147 lb (66.7 kg)     Height 06/09/23 0816 5' 3.5" (1.613 m)     Head Circumference --      Peak Flow --      Pain Score 06/09/23 0816 10     Pain Loc --      Pain Education --      Exclude from Growth Chart --     Most recent vital signs: Vitals:   06/09/23 0831 06/09/23 1218  BP: (!) 135/107 (!) 131/97  Pulse: 93 79  Resp:  18  Temp:  98.2 F (36.8 C)  SpO2: 98% 100%    General: Awake, no distress.  CV:  Good peripheral perfusion.  Resp:  Normal effort.  Abd:  No distention.  Other:  No discharge in vaginal vault.  IUD strings are readily visible.  Discharge noted from cervical os.  Cervical motion tenderness is noted.   ED Results / Procedures / Treatments   Labs (all labs ordered are listed, but only abnormal results are displayed) Labs Reviewed  WET PREP, GENITAL - Abnormal; Notable for the following components:      Result Value   Clue Cells Wet Prep HPF POC PRESENT (*)     WBC, Wet Prep HPF POC >=10 (*)    All other components within normal limits  CHLAMYDIA/NGC RT PCR ([facility] ONLY)           - Abnormal; Notable for the following components:   N gonorrhoeae DETECTED (*)    All other components within normal limits  COMPREHENSIVE METABOLIC PANEL WITH GFR - Abnormal; Notable for the following components:   AST 12 (*)    All other components within normal limits  CBC - Abnormal; Notable for the following components:   WBC 13.1 (*)    MCV 74.7 (*)    All other components within normal limits  LIPASE, BLOOD     EKG  Not indicated   RADIOLOGY  Image and radiology report reviewed and interpreted by me. Radiology report consistent with the same.  Not indicated  PROCEDURES:  Critical Care performed: No  Procedures   MEDICATIONS ORDERED IN ED:  Medications  cefTRIAXone  (ROCEPHIN ) injection 500 mg (500 mg  Intramuscular Given 06/09/23 1207)  ibuprofen  (ADVIL ) tablet 600 mg (600 mg Oral Given 06/09/23 1207)     IMPRESSION / MDM / ASSESSMENT AND PLAN / ED COURSE   I have reviewed the triage note.  Differential diagnosis includes, but is not limited to, bacterial vaginosis, trichomonas, chlamydia, gonorrhea, PID, UTI  Patient's presentation is most consistent with acute illness / injury with system symptoms.  28 year old female presenting to the emergency department for treatment and evaluation of 2 months of pelvic pain and vaginal bleeding.  See HPI for further details.  Pelvic exam performed.  IUD strings are readily visible.  Swabs collected for STI testing.  CBC indicates a mild leukocytosis of 13.1.  CMP is without concern.  Wet prep shows positive clue cells and greater than 10 white blood cells.  She is positive for gonorrhea.  Vital signs are reassuring.  Results discussed with the patient.  Plan will be to treat her with the azithromycin , Rocephin , and doxycycline .  She was strongly encouraged to avoid intercourse with her partner until  he is tested and/or treated.  After treatment, they will need to wait at least 1 week.  Patient advised to return to the emergency department for symptoms that change or worsen if unable to see someone at the health department or gynecology.      FINAL CLINICAL IMPRESSION(S) / ED DIAGNOSES   Final diagnoses:  Gonorrhea  Bacterial vaginosis     Rx / DC Orders   ED Discharge Orders          Ordered    doxycycline  (VIBRAMYCIN ) 100 MG capsule  2 times daily        06/09/23 1155             Note:  This document was prepared using Dragon voice recognition software and may include unintentional dictation errors.   Sherryle Don, FNP 06/10/23 1458    Viviano Ground, MD 06/15/23 1028

## 2023-09-02 ENCOUNTER — Ambulatory Visit

## 2023-09-09 ENCOUNTER — Ambulatory Visit

## 2023-09-24 ENCOUNTER — Ambulatory Visit

## 2023-10-06 ENCOUNTER — Ambulatory Visit

## 2023-11-23 ENCOUNTER — Ambulatory Visit

## 2023-11-26 ENCOUNTER — Ambulatory Visit

## 2023-12-23 ENCOUNTER — Ambulatory Visit: Admitting: Family Medicine

## 2024-02-09 ENCOUNTER — Encounter: Payer: Self-pay | Admitting: *Deleted

## 2024-02-09 ENCOUNTER — Other Ambulatory Visit: Payer: Self-pay

## 2024-02-09 ENCOUNTER — Emergency Department
Admission: EM | Admit: 2024-02-09 | Discharge: 2024-02-09 | Disposition: A | Discharge status: Home / Self Care | Attending: Emergency Medicine | Admitting: Emergency Medicine

## 2024-02-09 DIAGNOSIS — T7840XA Allergy, unspecified, initial encounter: Secondary | ICD-10-CM | POA: Insufficient documentation

## 2024-02-09 MED ORDER — DIPHENHYDRAMINE HCL 50 MG/ML IJ SOLN
50.0000 mg | Freq: Once | INTRAMUSCULAR | Status: AC
  Administered 2024-02-09: 50 mg via INTRAVENOUS
  Filled 2024-02-09: qty 1

## 2024-02-09 MED ORDER — SODIUM CHLORIDE 0.9 % IV BOLUS
1000.0000 mL | Freq: Once | INTRAVENOUS | Status: AC
  Administered 2024-02-09: 1000 mL via INTRAVENOUS

## 2024-02-09 MED ORDER — PREDNISONE 50 MG PO TABS: 50.0000 mg | ORAL_TABLET | Freq: Every day | ORAL | 0 refills | Status: AC

## 2024-02-09 MED ORDER — METHYLPREDNISOLONE SODIUM SUCC 125 MG IJ SOLR
125.0000 mg | Freq: Once | INTRAMUSCULAR | Status: AC
  Administered 2024-02-09: 125 mg via INTRAVENOUS
  Filled 2024-02-09: qty 2

## 2024-02-09 MED ORDER — EPINEPHRINE 0.3 MG/0.3ML IJ SOAJ: 0.3000 mg | INTRAMUSCULAR | 1 refills | Status: AC | PRN

## 2024-02-09 MED ORDER — FAMOTIDINE IN NACL 20-0.9 MG/50ML-% IV SOLN
20.0000 mg | Freq: Once | INTRAVENOUS | Status: AC
  Administered 2024-02-09: 20 mg via INTRAVENOUS
  Filled 2024-02-09: qty 50

## 2024-02-09 NOTE — ED Triage Notes (Signed)
 Pt to triage via wheelchair.  Pt reports facial swelling and hives for 10 minutes.  Pt has itching no resp distress.  Pt alert

## 2024-02-09 NOTE — Discharge Instructions (Addendum)
 Please use 25-50 mg of Benadryl  every 8 hours for the next 72 hours (3 days)

## 2024-02-09 NOTE — ED Provider Notes (Signed)
 "  [facility] Provider Note   Event Date/Time   First MD Initiated Contact with Patient 02/09/24 0013     (approximate) History  Allergic Reaction  HPI Carrie Frazier is a 29 y.o. female with a stated past medical history of eczema who presents complaining of of swelling to the face, lips, and tongue with associated hives over the body and arms.  Patient denies any known allergy contacts this evening.  Patient endorses mild shortness of breath but denies any abdominal pain, nausea, or vomiting.  Patient has not taken any medications prior to arrival ROS: Patient currently denies any vision changes, tinnitus, difficulty speaking, facial droop, sore throat, chest pain, shortness of breath, abdominal pain, nausea/vomiting/diarrhea, dysuria, or weakness/numbness/paresthesias in any extremity   Physical Exam  Triage Vital Signs: ED Triage Vitals  Encounter Vitals Group     BP 02/09/24 0011 105/65     Girls Systolic BP Percentile --      Girls Diastolic BP Percentile --      Boys Systolic BP Percentile --      Boys Diastolic BP Percentile --      Pulse Rate 02/09/24 0011 (!) 124     Resp 02/09/24 0011 20     Temp 02/09/24 0011 (!) 97.5 F (36.4 C)     Temp Source 02/09/24 0011 Oral     SpO2 02/09/24 0011 96 %     Weight 02/09/24 0012 135 lb (61.2 kg)     Height 02/09/24 0012 5' 3 (1.6 m)     Head Circumference --      Peak Flow --      Pain Score 02/09/24 0012 10     Pain Loc --      Pain Education --      Exclude from Growth Chart --    Most recent vital signs: Vitals:   02/09/24 0011  BP: 105/65  Pulse: (!) 124  Resp: 20  Temp: (!) 97.5 F (36.4 C)  SpO2: 96%   General: Awake, oriented x4. CV:  Good peripheral perfusion. Resp:  Normal effort.  CTAB Abd:  No distention. Other:  Middle-aged well-developed, well-nourished African-American female resting comfortably in no acute distress.  Significant edema to bilateral eyelids, lips, and  tongue.  No stridor ED Results / Procedures / Treatments  Labs (all labs ordered are listed, but only abnormal results are displayed) Labs Reviewed - No data to display PROCEDURES: Critical Care performed: No Procedures MEDICATIONS ORDERED IN ED: Medications  diphenhydrAMINE  (BENADRYL ) injection 50 mg (50 mg Intravenous Given 02/09/24 0027)  methylPREDNISolone  sodium succinate (SOLU-MEDROL ) 125 mg/2 mL injection 125 mg (125 mg Intravenous Given 02/09/24 0032)  famotidine  (PEPCID ) IVPB 20 mg premix (20 mg Intravenous New Bag/Given 02/09/24 0032)   IMPRESSION / MDM / ASSESSMENT AND PLAN / ED COURSE  I reviewed the triage vital signs and the nursing notes.                             The patient is on the cardiac monitor to evaluate for evidence of arrhythmia and/or significant heart rate changes. Patient's presentation is most consistent with acute presentation with potential threat to life or bodily function. Patient is a 29 year old female who presents with signs and symptoms of allergic reaction + Cutaneous hives/erythema + Facial swelling/tongue swelling Given history and exam, presentation most consistent with allergic reaction. I have low suspicion for toxic shock syndrome, anaphylaxis, asthma exacerbation,  or drug toxicity. Tx: solumedrol, benadryl , pepcid  Rx: Prednisone  60mg  qday x3days, Benadryl  25mg  q8hr x3days Disposition: Discharge home with SRP. Follow up with PCP in 1-2 days.   FINAL CLINICAL IMPRESSION(S) / ED DIAGNOSES   Final diagnoses:  Allergic reaction, initial encounter   Rx / DC Orders   ED Discharge Orders          Ordered    predniSONE  (DELTASONE ) 50 MG tablet  Daily with breakfast        02/09/24 0056    EPINEPHrine  (EPIPEN  2-PAK) 0.3 mg/0.3 mL IJ SOAJ injection  As needed        02/09/24 0056           Note:  This document was prepared using Dragon voice recognition software and may include unintentional dictation errors.   Jashanti Clinkscale K,  MD 02/09/24 0104  "
